# Patient Record
Sex: Female | Born: 1945 | Race: White | Hispanic: No | Marital: Married | Smoking: Never smoker
Health system: Southern US, Community
[De-identification: ages and names within clinical notes are randomized; demographics above are authoritative.]

## PROBLEM LIST (undated history)

## (undated) DIAGNOSIS — M1990 Unspecified osteoarthritis, unspecified site: Secondary | ICD-10-CM

## (undated) DIAGNOSIS — I10 Essential (primary) hypertension: Secondary | ICD-10-CM

## (undated) DIAGNOSIS — Z889 Allergy status to unspecified drugs, medicaments and biological substances status: Secondary | ICD-10-CM

## (undated) DIAGNOSIS — K219 Gastro-esophageal reflux disease without esophagitis: Secondary | ICD-10-CM

## (undated) HISTORY — PX: OTHER SURGICAL HISTORY: SHX169

## (undated) HISTORY — PX: APPENDECTOMY: SHX54

## (undated) HISTORY — PX: ABDOMINAL HYSTERECTOMY: SHX81

---

## 2001-05-23 ENCOUNTER — Encounter: Payer: Self-pay | Admitting: *Deleted

## 2001-05-23 ENCOUNTER — Emergency Department (HOSPITAL_COMMUNITY): Admission: EM | Admit: 2001-05-23 | Discharge: 2001-05-24 | Payer: Self-pay | Admitting: *Deleted

## 2001-07-19 ENCOUNTER — Encounter: Admission: RE | Admit: 2001-07-19 | Discharge: 2001-07-19 | Payer: Self-pay | Admitting: Family Medicine

## 2001-07-19 ENCOUNTER — Encounter: Payer: Self-pay | Admitting: Family Medicine

## 2001-08-15 ENCOUNTER — Encounter: Payer: Self-pay | Admitting: Family Medicine

## 2001-08-15 ENCOUNTER — Encounter: Admission: RE | Admit: 2001-08-15 | Discharge: 2001-08-15 | Payer: Self-pay | Admitting: Family Medicine

## 2009-11-02 ENCOUNTER — Encounter: Admission: RE | Admit: 2009-11-02 | Discharge: 2009-11-02 | Payer: Self-pay | Admitting: Internal Medicine

## 2011-05-03 ENCOUNTER — Other Ambulatory Visit: Payer: Self-pay | Admitting: Internal Medicine

## 2011-05-03 DIAGNOSIS — M79605 Pain in left leg: Secondary | ICD-10-CM

## 2011-05-03 DIAGNOSIS — M545 Low back pain: Secondary | ICD-10-CM

## 2011-05-24 ENCOUNTER — Other Ambulatory Visit: Payer: Self-pay

## 2011-05-31 ENCOUNTER — Ambulatory Visit
Admission: RE | Admit: 2011-05-31 | Discharge: 2011-05-31 | Disposition: A | Discharge status: Home / Self Care | Payer: Medicare Other | Source: Ambulatory Visit | Attending: Internal Medicine | Admitting: Internal Medicine

## 2011-05-31 DIAGNOSIS — M79605 Pain in left leg: Secondary | ICD-10-CM

## 2011-05-31 DIAGNOSIS — M545 Low back pain: Secondary | ICD-10-CM

## 2012-07-20 ENCOUNTER — Other Ambulatory Visit: Payer: Self-pay | Admitting: Neurosurgery

## 2012-07-20 DIAGNOSIS — M5410 Radiculopathy, site unspecified: Secondary | ICD-10-CM

## 2012-07-20 DIAGNOSIS — M549 Dorsalgia, unspecified: Secondary | ICD-10-CM

## 2012-07-30 ENCOUNTER — Other Ambulatory Visit: Payer: Medicare Other

## 2012-07-30 ENCOUNTER — Inpatient Hospital Stay
Admission: RE | Admit: 2012-07-30 | Discharge: 2012-07-30 | Disposition: A | Discharge status: Home / Self Care | Payer: Medicare Other | Source: Ambulatory Visit | Attending: Neurosurgery | Admitting: Neurosurgery

## 2012-08-02 ENCOUNTER — Ambulatory Visit
Admission: RE | Admit: 2012-08-02 | Discharge: 2012-08-02 | Disposition: A | Discharge status: Home / Self Care | Payer: Medicare Other | Source: Ambulatory Visit | Attending: Neurosurgery | Admitting: Neurosurgery

## 2012-08-02 VITALS — BP 118/57 | HR 69

## 2012-08-02 DIAGNOSIS — M549 Dorsalgia, unspecified: Secondary | ICD-10-CM

## 2012-08-02 DIAGNOSIS — M5410 Radiculopathy, site unspecified: Secondary | ICD-10-CM

## 2012-08-02 MED ORDER — MEPERIDINE HCL 100 MG/ML IJ SOLN
75.0000 mg | Freq: Once | INTRAMUSCULAR | Status: AC
  Administered 2012-08-02: 75 mg via INTRAMUSCULAR

## 2012-08-02 MED ORDER — DIAZEPAM 5 MG PO TABS
5.0000 mg | ORAL_TABLET | Freq: Once | ORAL | Status: AC
  Administered 2012-08-02: 5 mg via ORAL

## 2012-08-02 MED ORDER — ONDANSETRON HCL 4 MG/2ML IJ SOLN
4.0000 mg | Freq: Four times a day (QID) | INTRAMUSCULAR | Status: DC | PRN
  Administered 2012-08-02: 4 mg via INTRAVENOUS

## 2012-08-02 MED ORDER — IOHEXOL 180 MG/ML  SOLN
15.0000 mL | Freq: Once | INTRAMUSCULAR | Status: AC | PRN
  Administered 2012-08-02: 15 mL via INTRATHECAL

## 2012-08-02 MED ORDER — ONDANSETRON HCL 4 MG/2ML IJ SOLN
4.0000 mg | Freq: Once | INTRAMUSCULAR | Status: AC
  Administered 2012-08-02: 4 mg via INTRAMUSCULAR

## 2012-08-02 NOTE — Progress Notes (Signed)
Patient states her last dose of Tramadol was early Tuesday morning (07/31/12).  Discharge instructions explained to patient.  jkl

## 2012-08-02 NOTE — Progress Notes (Signed)
Husband at bedside.  Patient resting quietly without complaint.  jkl

## 2012-08-16 ENCOUNTER — Encounter (HOSPITAL_COMMUNITY): Payer: Self-pay | Admitting: Pharmacy Technician

## 2012-08-17 ENCOUNTER — Other Ambulatory Visit: Payer: Self-pay | Admitting: Neurosurgery

## 2012-08-17 ENCOUNTER — Encounter (HOSPITAL_COMMUNITY): Payer: Self-pay

## 2012-08-17 ENCOUNTER — Ambulatory Visit (HOSPITAL_COMMUNITY)
Admission: RE | Admit: 2012-08-17 | Discharge: 2012-08-17 | Disposition: A | Discharge status: Home / Self Care | Payer: Medicare Other | Source: Ambulatory Visit | Attending: Neurosurgery | Admitting: Neurosurgery

## 2012-08-17 ENCOUNTER — Encounter (HOSPITAL_COMMUNITY)
Admission: RE | Admit: 2012-08-17 | Discharge: 2012-08-17 | Disposition: A | Discharge status: Home / Self Care | Payer: Medicare Other | Source: Ambulatory Visit | Attending: Neurosurgery | Admitting: Neurosurgery

## 2012-08-17 DIAGNOSIS — Z01812 Encounter for preprocedural laboratory examination: Secondary | ICD-10-CM | POA: Insufficient documentation

## 2012-08-17 DIAGNOSIS — Z01810 Encounter for preprocedural cardiovascular examination: Secondary | ICD-10-CM | POA: Insufficient documentation

## 2012-08-17 DIAGNOSIS — I10 Essential (primary) hypertension: Secondary | ICD-10-CM | POA: Insufficient documentation

## 2012-08-17 DIAGNOSIS — Z01818 Encounter for other preprocedural examination: Secondary | ICD-10-CM | POA: Insufficient documentation

## 2012-08-17 HISTORY — DX: Unspecified osteoarthritis, unspecified site: M19.90

## 2012-08-17 HISTORY — DX: Essential (primary) hypertension: I10

## 2012-08-17 HISTORY — DX: Gastro-esophageal reflux disease without esophagitis: K21.9

## 2012-08-17 HISTORY — DX: Allergy status to unspecified drugs, medicaments and biological substances: Z88.9

## 2012-08-17 LAB — CBC
HCT: 40 % (ref 36.0–46.0)
Hemoglobin: 13.5 g/dL (ref 12.0–15.0)
MCH: 29.2 pg (ref 26.0–34.0)
MCHC: 33.8 g/dL (ref 30.0–36.0)
MCV: 86.6 fL (ref 78.0–100.0)
Platelets: 337 10*3/uL (ref 150–400)
RBC: 4.62 MIL/uL (ref 3.87–5.11)
RDW: 13.2 % (ref 11.5–15.5)
WBC: 12.7 10*3/uL — ABNORMAL HIGH (ref 4.0–10.5)

## 2012-08-17 LAB — TYPE AND SCREEN
ABO/RH(D): A POS
Antibody Screen: NEGATIVE

## 2012-08-17 LAB — COMPREHENSIVE METABOLIC PANEL
ALT: 14 U/L (ref 0–35)
AST: 18 U/L (ref 0–37)
Albumin: 4.2 g/dL (ref 3.5–5.2)
Alkaline Phosphatase: 68 U/L (ref 39–117)
BUN: 20 mg/dL (ref 6–23)
CO2: 27 mEq/L (ref 19–32)
Calcium: 9.8 mg/dL (ref 8.4–10.5)
Chloride: 104 mEq/L (ref 96–112)
Creatinine, Ser: 0.85 mg/dL (ref 0.50–1.10)
GFR calc Af Amer: 80 mL/min — ABNORMAL LOW (ref >90)
GFR calc non Af Amer: 69 mL/min — ABNORMAL LOW (ref >90)
Glucose, Bld: 90 mg/dL (ref 70–99)
Potassium: 4.1 mEq/L (ref 3.5–5.1)
Sodium: 141 mEq/L (ref 135–145)
Total Bilirubin: 0.3 mg/dL (ref 0.3–1.2)
Total Protein: 7.8 g/dL (ref 6.0–8.3)

## 2012-08-17 LAB — URINALYSIS, ROUTINE W REFLEX MICROSCOPIC
Bilirubin Urine: NEGATIVE
Glucose, UA: NEGATIVE mg/dL
Hgb urine dipstick: NEGATIVE
Ketones, ur: NEGATIVE mg/dL
Leukocytes, UA: NEGATIVE
Nitrite: NEGATIVE
Protein, ur: NEGATIVE mg/dL
Specific Gravity, Urine: 1.015 (ref 1.005–1.030)
Urobilinogen, UA: 0.2 mg/dL (ref 0.0–1.0)
pH: 7 (ref 5.0–8.0)

## 2012-08-17 LAB — PROTIME-INR
INR: 0.92 (ref 0.00–1.49)
Prothrombin Time: 12.3 seconds (ref 11.6–15.2)

## 2012-08-17 LAB — SURGICAL PCR SCREEN
MRSA, PCR: NEGATIVE
Staphylococcus aureus: NEGATIVE

## 2012-08-17 LAB — ABO/RH: ABO/RH(D): A POS

## 2012-08-17 LAB — APTT: aPTT: 31 seconds (ref 24–37)

## 2012-08-17 NOTE — Pre-Procedure Instructions (Signed)
Shelby Newton  08/17/2012   Your procedure is scheduled on:  MONDAY, MAY 12TH   Report to [facility] at 6:45 AM.  Call this number if you have problems the morning of surgery: [phone]   Remember:   Do not eat food or drink liquids after midnight Sunday.   Take these medicines the morning of surgery with A SIP OF WATER:  Hydrocodone   Do not wear jewelry, make-up or nail polish.  Do not wear lotions, powders, or perfumes. You may wear deodorant.  Do not shave underarms & legs 48 hours prior to surgery.    Do not bring valuables to the hospital.  Contacts, dentures or bridgework may not be worn into surgery.   Leave suitcase in the car. After surgery it may be brought to your room.  For patients admitted to the hospital, checkout time is 11:00 AM the day of discharge.              Name and phone number of your driver:   Shelby Newton  -   Spouse   Of  21  LOVELY & GREAT YEARS!!!   Special Instructions: Shower using CHG 2 nights before surgery and the night before surgery.  If you shower the day of surgery use CHG.  Use special wash - you have one bottle of CHG for all showers.  You should use approximately 1/3 of the bottle for each shower.   Please read over the following fact sheets that you were given: Pain Booklet, Blood Transfusion Information, MRSA Information and Surgical Site Infection Prevention

## 2012-08-17 NOTE — Progress Notes (Signed)
1600 left jessica a voicemail to have Dr. Lovell Sheehan give [place] some orders.   DA

## 2012-08-20 ENCOUNTER — Inpatient Hospital Stay (HOSPITAL_COMMUNITY): Payer: Medicare Other | Admitting: Anesthesiology

## 2012-08-20 ENCOUNTER — Encounter (HOSPITAL_COMMUNITY): Payer: Self-pay | Admitting: Vascular Surgery

## 2012-08-20 ENCOUNTER — Inpatient Hospital Stay (HOSPITAL_COMMUNITY)
Admission: RE | Admit: 2012-08-20 | Discharge: 2012-08-24 | DRG: 460 | Disposition: A | Discharge status: Home / Self Care | Payer: Medicare Other | Source: Ambulatory Visit | Attending: Neurosurgery | Admitting: Neurosurgery

## 2012-08-20 ENCOUNTER — Inpatient Hospital Stay (HOSPITAL_COMMUNITY): Payer: Medicare Other

## 2012-08-20 ENCOUNTER — Encounter (HOSPITAL_COMMUNITY): Payer: Self-pay | Admitting: *Deleted

## 2012-08-20 ENCOUNTER — Encounter (HOSPITAL_COMMUNITY)
Admission: RE | Disposition: A | Discharge status: Home / Self Care | Payer: Self-pay | Source: Ambulatory Visit | Attending: Neurosurgery

## 2012-08-20 DIAGNOSIS — M48062 Spinal stenosis, lumbar region with neurogenic claudication: Secondary | ICD-10-CM

## 2012-08-20 DIAGNOSIS — I10 Essential (primary) hypertension: Secondary | ICD-10-CM | POA: Diagnosis present

## 2012-08-20 DIAGNOSIS — G9741 Accidental puncture or laceration of dura during a procedure: Secondary | ICD-10-CM | POA: Diagnosis not present

## 2012-08-20 DIAGNOSIS — IMO0002 Reserved for concepts with insufficient information to code with codable children: Secondary | ICD-10-CM | POA: Diagnosis present

## 2012-08-20 DIAGNOSIS — M5137 Other intervertebral disc degeneration, lumbosacral region: Principal | ICD-10-CM | POA: Diagnosis present

## 2012-08-20 DIAGNOSIS — M51379 Other intervertebral disc degeneration, lumbosacral region without mention of lumbar back pain or lower extremity pain: Principal | ICD-10-CM | POA: Diagnosis present

## 2012-08-20 DIAGNOSIS — K219 Gastro-esophageal reflux disease without esophagitis: Secondary | ICD-10-CM | POA: Diagnosis present

## 2012-08-20 HISTORY — PX: TRANSFORAMINAL LUMBAR INTERBODY FUSION (TLIF) WITH PEDICLE SCREW FIXATION 1 LEVEL: SHX6141

## 2012-08-20 SURGERY — TRANSFORAMINAL LUMBAR INTERBODY FUSION (TLIF) WITH PEDICLE SCREW FIXATION 1 LEVEL
Anesthesia: General | Site: Back | Wound class: Clean

## 2012-08-20 MED ORDER — LIDOCAINE HCL (CARDIAC) 20 MG/ML IV SOLN
INTRAVENOUS | Status: DC | PRN
  Administered 2012-08-20: 50 mg via INTRAVENOUS

## 2012-08-20 MED ORDER — MIDAZOLAM HCL 5 MG/5ML IJ SOLN
INTRAMUSCULAR | Status: DC | PRN
  Administered 2012-08-20: 2 mg via INTRAVENOUS

## 2012-08-20 MED ORDER — HYDROCODONE-ACETAMINOPHEN 5-325 MG PO TABS
1.0000 | ORAL_TABLET | ORAL | Status: DC | PRN
  Administered 2012-08-23 (×2): 1 via ORAL
  Filled 2012-08-20 (×2): qty 1

## 2012-08-20 MED ORDER — LACTATED RINGERS IV SOLN
INTRAVENOUS | Status: DC | PRN
  Administered 2012-08-20 (×4): via INTRAVENOUS

## 2012-08-20 MED ORDER — ONDANSETRON HCL 4 MG/2ML IJ SOLN
INTRAMUSCULAR | Status: AC
  Filled 2012-08-20: qty 2

## 2012-08-20 MED ORDER — PROPOFOL 10 MG/ML IV BOLUS
INTRAVENOUS | Status: DC | PRN
  Administered 2012-08-20: 150 mg via INTRAVENOUS

## 2012-08-20 MED ORDER — PHENOL 1.4 % MT LIQD
1.0000 | OROMUCOSAL | Status: DC | PRN
Start: 2012-08-20 — End: 2012-08-24

## 2012-08-20 MED ORDER — LACTATED RINGERS IV SOLN
INTRAVENOUS | Status: DC
  Administered 2012-08-20 – 2012-08-21 (×2): via INTRAVENOUS
  Administered 2012-08-21: 75 mL/h via INTRAVENOUS
  Administered 2012-08-22: 04:00:00 via INTRAVENOUS

## 2012-08-20 MED ORDER — BACITRACIN ZINC 500 UNIT/GM EX OINT
TOPICAL_OINTMENT | CUTANEOUS | Status: DC | PRN
  Administered 2012-08-20: 1 via TOPICAL

## 2012-08-20 MED ORDER — FENTANYL CITRATE 0.05 MG/ML IJ SOLN
INTRAMUSCULAR | Status: DC | PRN
  Administered 2012-08-20: 50 ug via INTRAVENOUS
  Administered 2012-08-20: 80 ug via INTRAVENOUS
  Administered 2012-08-20: 50 ug via INTRAVENOUS

## 2012-08-20 MED ORDER — ALBUMIN HUMAN 5 % IV SOLN
INTRAVENOUS | Status: DC | PRN
  Administered 2012-08-20: 12:00:00 via INTRAVENOUS

## 2012-08-20 MED ORDER — GLYCOPYRROLATE 0.2 MG/ML IJ SOLN
INTRAMUSCULAR | Status: DC | PRN
  Administered 2012-08-20: .7 mg via INTRAVENOUS

## 2012-08-20 MED ORDER — ACETAMINOPHEN 650 MG RE SUPP: 650.0000 mg | RECTAL | Status: DC | PRN

## 2012-08-20 MED ORDER — SODIUM CHLORIDE 0.9 % IV SOLN
INTRAVENOUS | Status: AC
  Filled 2012-08-20: qty 500

## 2012-08-20 MED ORDER — OXYCODONE-ACETAMINOPHEN 5-325 MG PO TABS
1.0000 | ORAL_TABLET | ORAL | Status: DC | PRN
  Administered 2012-08-22: 2 via ORAL
  Administered 2012-08-23 – 2012-08-24 (×2): 1 via ORAL
  Filled 2012-08-20: qty 2
  Filled 2012-08-20: qty 1
  Filled 2012-08-20: qty 2
  Filled 2012-08-20: qty 1

## 2012-08-20 MED ORDER — PHENYLEPHRINE HCL 10 MG/ML IJ SOLN
INTRAMUSCULAR | Status: DC | PRN
Start: 2012-08-20 — End: 2012-08-20
  Administered 2012-08-20: 80 ug via INTRAVENOUS
  Administered 2012-08-20 (×2): 40 ug via INTRAVENOUS
  Administered 2012-08-20: 80 ug via INTRAVENOUS
  Administered 2012-08-20 (×2): 120 ug via INTRAVENOUS

## 2012-08-20 MED ORDER — NEOSTIGMINE METHYLSULFATE 1 MG/ML IJ SOLN
INTRAMUSCULAR | Status: DC | PRN
  Administered 2012-08-20: 4 mg via INTRAVENOUS

## 2012-08-20 MED ORDER — HYDROMORPHONE 0.3 MG/ML IV SOLN
INTRAVENOUS | Status: DC
  Administered 2012-08-20: 0.1 mg via INTRAVENOUS
  Administered 2012-08-21: 0.6 mg via INTRAVENOUS
  Administered 2012-08-21: 0.9 mg via INTRAVENOUS

## 2012-08-20 MED ORDER — SODIUM CHLORIDE 0.9 % IR SOLN
Status: DC | PRN
  Administered 2012-08-20: 11:00:00

## 2012-08-20 MED ORDER — SODIUM CHLORIDE 0.9 % IJ SOLN: 9.0000 mL | INTRAMUSCULAR | Status: DC | PRN

## 2012-08-20 MED ORDER — ONDANSETRON HCL 4 MG/2ML IJ SOLN
4.0000 mg | INTRAMUSCULAR | Status: DC | PRN
  Administered 2012-08-20 – 2012-08-22 (×9): 4 mg via INTRAVENOUS
  Filled 2012-08-20 (×9): qty 2

## 2012-08-20 MED ORDER — MENTHOL 3 MG MT LOZG: 1.0000 | LOZENGE | OROMUCOSAL | Status: DC | PRN

## 2012-08-20 MED ORDER — CEFAZOLIN SODIUM-DEXTROSE 2-3 GM-% IV SOLR
2.0000 g | INTRAVENOUS | Status: AC
  Administered 2012-08-20: 2 g via INTRAVENOUS

## 2012-08-20 MED ORDER — DIPHENHYDRAMINE HCL 12.5 MG/5ML PO ELIX: 12.5000 mg | ORAL_SOLUTION | Freq: Four times a day (QID) | ORAL | Status: DC | PRN

## 2012-08-20 MED ORDER — VALSARTAN-HYDROCHLOROTHIAZIDE 320-12.5 MG PO TABS: 1.0000 | ORAL_TABLET | Freq: Every day | ORAL | Status: DC

## 2012-08-20 MED ORDER — HYDROCHLOROTHIAZIDE 12.5 MG PO CAPS
12.5000 mg | ORAL_CAPSULE | Freq: Every day | ORAL | Status: DC
  Administered 2012-08-21 – 2012-08-24 (×3): 12.5 mg via ORAL
  Filled 2012-08-20 (×5): qty 1

## 2012-08-20 MED ORDER — HYDROMORPHONE HCL PF 1 MG/ML IJ SOLN
INTRAMUSCULAR | Status: DC | PRN
  Administered 2012-08-20: 0.5 mg via INTRAVENOUS

## 2012-08-20 MED ORDER — HYDROMORPHONE 0.3 MG/ML IV SOLN
INTRAVENOUS | Status: AC
  Administered 2012-08-20: 15:00:00
  Filled 2012-08-20: qty 25

## 2012-08-20 MED ORDER — BUPIVACAINE-EPINEPHRINE PF 0.5-1:200000 % IJ SOLN
INTRAMUSCULAR | Status: DC | PRN
  Administered 2012-08-20: 30 mL

## 2012-08-20 MED ORDER — ACETAMINOPHEN 325 MG PO TABS
650.0000 mg | ORAL_TABLET | ORAL | Status: DC | PRN
  Administered 2012-08-21: 650 mg via ORAL
  Filled 2012-08-20: qty 2

## 2012-08-20 MED ORDER — NALOXONE HCL 0.4 MG/ML IJ SOLN: 0.4000 mg | INTRAMUSCULAR | Status: DC | PRN

## 2012-08-20 MED ORDER — DIPHENHYDRAMINE HCL 50 MG/ML IJ SOLN: 12.5000 mg | Freq: Four times a day (QID) | INTRAMUSCULAR | Status: DC | PRN

## 2012-08-20 MED ORDER — IRBESARTAN 300 MG PO TABS
300.0000 mg | ORAL_TABLET | Freq: Every day | ORAL | Status: DC
  Administered 2012-08-21 – 2012-08-24 (×3): 300 mg via ORAL
  Filled 2012-08-20 (×5): qty 1

## 2012-08-20 MED ORDER — ROCURONIUM BROMIDE 100 MG/10ML IV SOLN
INTRAVENOUS | Status: DC | PRN
  Administered 2012-08-20 (×2): 10 mg via INTRAVENOUS
  Administered 2012-08-20: 50 mg via INTRAVENOUS
  Administered 2012-08-20: 10 mg via INTRAVENOUS

## 2012-08-20 MED ORDER — ONDANSETRON HCL 4 MG/2ML IJ SOLN: 4.0000 mg | Freq: Four times a day (QID) | INTRAMUSCULAR | Status: DC | PRN

## 2012-08-20 MED ORDER — DIAZEPAM 5 MG PO TABS
5.0000 mg | ORAL_TABLET | Freq: Four times a day (QID) | ORAL | Status: DC | PRN
  Administered 2012-08-23 – 2012-08-24 (×4): 5 mg via ORAL
  Filled 2012-08-20 (×4): qty 1

## 2012-08-20 MED ORDER — CEFAZOLIN SODIUM-DEXTROSE 2-3 GM-% IV SOLR
INTRAVENOUS | Status: AC
  Filled 2012-08-20: qty 50

## 2012-08-20 MED ORDER — SIMVASTATIN 10 MG PO TABS
10.0000 mg | ORAL_TABLET | Freq: Every day | ORAL | Status: DC
  Administered 2012-08-21: 10 mg via ORAL
  Filled 2012-08-20 (×5): qty 1

## 2012-08-20 MED ORDER — BACITRACIN 50000 UNITS IM SOLR
INTRAMUSCULAR | Status: AC
  Filled 2012-08-20: qty 1

## 2012-08-20 MED ORDER — THROMBIN 20000 UNITS EX SOLR
CUTANEOUS | Status: DC | PRN
  Administered 2012-08-20: 11:00:00 via TOPICAL

## 2012-08-20 MED ORDER — DOCUSATE SODIUM 100 MG PO CAPS
100.0000 mg | ORAL_CAPSULE | Freq: Two times a day (BID) | ORAL | Status: DC
  Administered 2012-08-21 – 2012-08-24 (×6): 100 mg via ORAL
  Filled 2012-08-20 (×6): qty 1

## 2012-08-20 MED ORDER — EPHEDRINE SULFATE 50 MG/ML IJ SOLN
INTRAMUSCULAR | Status: DC | PRN
  Administered 2012-08-20 (×2): 5 mg via INTRAVENOUS
  Administered 2012-08-20: 10 mg via INTRAVENOUS
  Administered 2012-08-20: 5 mg via INTRAVENOUS

## 2012-08-20 MED ORDER — MORPHINE SULFATE 2 MG/ML IJ SOLN
1.0000 mg | INTRAMUSCULAR | Status: DC | PRN
  Administered 2012-08-21: 2 mg via INTRAVENOUS
  Administered 2012-08-21: 4 mg via INTRAVENOUS
  Administered 2012-08-22: 2 mg via INTRAVENOUS
  Filled 2012-08-20: qty 1
  Filled 2012-08-20: qty 2
  Filled 2012-08-20: qty 1

## 2012-08-20 MED ORDER — ZOLPIDEM TARTRATE 5 MG PO TABS: 5.0000 mg | ORAL_TABLET | Freq: Every evening | ORAL | Status: DC | PRN

## 2012-08-20 MED ORDER — ALUM & MAG HYDROXIDE-SIMETH 200-200-20 MG/5ML PO SUSP: 30.0000 mL | Freq: Four times a day (QID) | ORAL | Status: DC | PRN

## 2012-08-20 MED ORDER — CEFAZOLIN SODIUM-DEXTROSE 2-3 GM-% IV SOLR
2.0000 g | Freq: Three times a day (TID) | INTRAVENOUS | Status: AC
  Administered 2012-08-20 – 2012-08-21 (×2): 2 g via INTRAVENOUS
  Filled 2012-08-20 (×3): qty 50

## 2012-08-20 SURGICAL SUPPLY — 83 items
APL SKNCLS STERI-STRIP NONHPOA (GAUZE/BANDAGES/DRESSINGS) ×1
APL SRG 60D 8 XTD TIP BNDBL (TIP) ×2
BAG DECANTER FOR FLEXI CONT (MISCELLANEOUS) ×2 IMPLANT
BENZOIN TINCTURE PRP APPL 2/3 (GAUZE/BANDAGES/DRESSINGS) ×2 IMPLANT
BLADE SURG ROTATE 9660 (MISCELLANEOUS) IMPLANT
BRUSH SCRUB EZ PLAIN DRY (MISCELLANEOUS) ×2 IMPLANT
BUR ACORN 6.0 (BURR) ×2 IMPLANT
BUR MATCHSTICK NEURO 3.0 LAGG (BURR) ×2 IMPLANT
CANISTER SUCTION 2500CC (MISCELLANEOUS) ×2 IMPLANT
CAP REVERE LOCKING (Cap) ×4 IMPLANT
CLOTH BEACON ORANGE TIMEOUT ST (SAFETY) ×2 IMPLANT
CONT SPEC 4OZ CLIKSEAL STRL BL (MISCELLANEOUS) ×2 IMPLANT
COVER BACK TABLE 24X17X13 BIG (DRAPES) IMPLANT
COVER TABLE BACK 60X90 (DRAPES) ×2 IMPLANT
DRAPE C-ARM 42X72 X-RAY (DRAPES) ×4 IMPLANT
DRAPE LAPAROTOMY 100X72X124 (DRAPES) ×2 IMPLANT
DRAPE MICROSCOPE ZEISS OPMI (DRAPES) ×1 IMPLANT
DRAPE POUCH INSTRU U-SHP 10X18 (DRAPES) ×2 IMPLANT
DRAPE PROXIMA HALF (DRAPES) ×2 IMPLANT
DRAPE SURG 17X23 STRL (DRAPES) ×8 IMPLANT
DURASEAL APPLICATOR TIP (TIP) ×2 IMPLANT
DURASEAL SPINE SEALANT 3ML (MISCELLANEOUS) ×2 IMPLANT
ELECT BLADE 4.0 EZ CLEAN MEGAD (MISCELLANEOUS) ×2
ELECT REM PT RETURN 9FT ADLT (ELECTROSURGICAL) ×2
ELECTRODE BLDE 4.0 EZ CLN MEGD (MISCELLANEOUS) ×1 IMPLANT
ELECTRODE REM PT RTRN 9FT ADLT (ELECTROSURGICAL) ×1 IMPLANT
GAUZE SPONGE 4X4 16PLY XRAY LF (GAUZE/BANDAGES/DRESSINGS) ×2 IMPLANT
GLOVE BIO SURGEON STRL SZ8.5 (GLOVE) ×4 IMPLANT
GLOVE BIOGEL PI IND STRL 7.0 (GLOVE) IMPLANT
GLOVE BIOGEL PI IND STRL 7.5 (GLOVE) IMPLANT
GLOVE BIOGEL PI IND STRL 8 (GLOVE) IMPLANT
GLOVE BIOGEL PI IND STRL 8.5 (GLOVE) IMPLANT
GLOVE BIOGEL PI INDICATOR 7.0 (GLOVE) ×1
GLOVE BIOGEL PI INDICATOR 7.5 (GLOVE) ×1
GLOVE BIOGEL PI INDICATOR 8 (GLOVE) ×1
GLOVE BIOGEL PI INDICATOR 8.5 (GLOVE) ×1
GLOVE ECLIPSE 7.5 STRL STRAW (GLOVE) ×1 IMPLANT
GLOVE ECLIPSE 8.5 STRL (GLOVE) ×2 IMPLANT
GLOVE EXAM NITRILE LRG STRL (GLOVE) ×2 IMPLANT
GLOVE EXAM NITRILE MD LF STRL (GLOVE) IMPLANT
GLOVE EXAM NITRILE XL STR (GLOVE) IMPLANT
GLOVE EXAM NITRILE XS STR PU (GLOVE) IMPLANT
GLOVE OPTIFIT SS 6.5 STRL BRWN (GLOVE) ×3 IMPLANT
GLOVE SS BIOGEL STRL SZ 6.5 (GLOVE) IMPLANT
GLOVE SS BIOGEL STRL SZ 8 (GLOVE) ×2 IMPLANT
GLOVE SUPERSENSE BIOGEL SZ 6.5 (GLOVE) ×4
GLOVE SUPERSENSE BIOGEL SZ 8 (GLOVE) ×2
GOWN BRE IMP SLV AUR LG STRL (GOWN DISPOSABLE) ×1 IMPLANT
GOWN BRE IMP SLV AUR XL STRL (GOWN DISPOSABLE) ×5 IMPLANT
GOWN STRL REIN 2XL LVL4 (GOWN DISPOSABLE) ×1 IMPLANT
KIT BASIN OR (CUSTOM PROCEDURE TRAY) ×2 IMPLANT
KIT ROOM TURNOVER OR (KITS) ×2 IMPLANT
NDL HYPO 21X1.5 SAFETY (NEEDLE) IMPLANT
NEEDLE HYPO 21X1.5 SAFETY (NEEDLE) IMPLANT
NEEDLE HYPO 22GX1.5 SAFETY (NEEDLE) ×2 IMPLANT
NS IRRIG 1000ML POUR BTL (IV SOLUTION) ×2 IMPLANT
PACK FOAM VITOSS 10CC (Orthopedic Implant) ×1 IMPLANT
PACK LAMINECTOMY NEURO (CUSTOM PROCEDURE TRAY) ×2 IMPLANT
PAD ARMBOARD 7.5X6 YLW CONV (MISCELLANEOUS) ×6 IMPLANT
PATTIES SURGICAL .5 X1 (DISPOSABLE) IMPLANT
PUTTY 10ML ACTIFUSE ABX (Putty) ×1 IMPLANT
ROD REVERE 6.35 40MM (Rod) ×2 IMPLANT
RUBBERBAND STERILE (MISCELLANEOUS) ×2 IMPLANT
SCREW REVERE 6.5X50MM (Screw) ×4 IMPLANT
SPACER SIGNATURE SM 10MM (Spacer) ×1 IMPLANT
SPONGE GAUZE 4X4 12PLY (GAUZE/BANDAGES/DRESSINGS) ×2 IMPLANT
SPONGE LAP 4X18 X RAY DECT (DISPOSABLE) IMPLANT
SPONGE NEURO XRAY DETECT 1X3 (DISPOSABLE) IMPLANT
SPONGE SURGIFOAM ABS GEL 100 (HEMOSTASIS) ×2 IMPLANT
STRIP CLOSURE SKIN 1/2X4 (GAUZE/BANDAGES/DRESSINGS) ×2 IMPLANT
STRIP VITOSS 25X100X4MM (Neuro Prosthesis/Implant) ×1 IMPLANT
SUT PROLENE 6 0 BV (SUTURE) ×6 IMPLANT
SUT VIC AB 1 CT1 18XBRD ANBCTR (SUTURE) ×2 IMPLANT
SUT VIC AB 1 CT1 8-18 (SUTURE) ×4
SUT VIC AB 2-0 CP2 18 (SUTURE) ×4 IMPLANT
SYR 20CC LL (SYRINGE) IMPLANT
SYR 20ML ECCENTRIC (SYRINGE) ×2 IMPLANT
TAPE CLOTH SURG 4X10 WHT LF (GAUZE/BANDAGES/DRESSINGS) ×1 IMPLANT
TOWEL OR 17X24 6PK STRL BLUE (TOWEL DISPOSABLE) ×2 IMPLANT
TOWEL OR 17X26 10 PK STRL BLUE (TOWEL DISPOSABLE) ×2 IMPLANT
TRAY FOLEY CATH 14FRSI W/METER (CATHETERS) ×2 IMPLANT
TUBE CONNECTING 12X1/4 (SUCTIONS) ×1 IMPLANT
WATER STERILE IRR 1000ML POUR (IV SOLUTION) ×2 IMPLANT

## 2012-08-20 NOTE — Progress Notes (Signed)
UR COMPLETED  

## 2012-08-20 NOTE — Anesthesia Postprocedure Evaluation (Signed)
  Anesthesia Post-op Note  Patient: Shelby Newton  Procedure(s) Performed: Procedure(s) with comments: TRANSFORAMINAL LUMBAR INTERBODY FUSION (TLIF) WITH PEDICLE SCREW FIXATION ONE LEVEL (N/A) - Posteior Lumbar Four-Five Transforaminal Interbody and Fusion with Interbody Prothesis, Posterolateral Arthrodesis, and Posterior Nonsegmental Instrumentation  Patient Location: PACU  Anesthesia Type:General  Level of Consciousness: awake  Airway and Oxygen Therapy: Patient Spontanous Breathing  Post-op Pain: mild  Post-op Assessment: Post-op Vital signs reviewed  Post-op Vital Signs: Reviewed  Complications: No apparent anesthesia complications

## 2012-08-20 NOTE — Progress Notes (Signed)
Patient ID: Shelby Newton, female   DOB: 14-May-1945, 67 y.o.   MRN: [id number] Subjective: The patient is somnolent but easily arousable. She looks well. She is in no apparent distress.  Objective: Vital signs in last 24 hours: Temp:  [97 F (36.1 C)-98.2 F (36.8 C)] 98.2 F (36.8 C) (05/12 1600) Pulse Rate:  [62-95] 76 (05/12 1600) Resp:  [10-47] 12 (05/12 1600) BP: (152-162)/(51-82) 152/69 mmHg (05/12 1600) SpO2:  [93 %-97 %] 97 % (05/12 1600) Weight:  [86.592 kg (190 lb 14.4 oz)] 86.592 kg (190 lb 14.4 oz) (05/12 1615)  Intake/Output from previous day:   Intake/Output this shift: Total I/O In: 3550 [I.V.:3300; IV Piggyback:250] Out: 1000 [Urine:900; Blood:100]  Physical exam the patient's strength is normal in her bile gastrocnemius and dorsiflexors.  Lab Results: No results found for this basename: WBC, HGB, HCT, PLT,  in the last 72 hours BMET No results found for this basename: NA, K, CL, CO2, GLUCOSE, BUN, CREATININE, CALCIUM,  in the last 72 hours  Studies/Results: Dg Lumbar Spine 2-3 Views  08/20/2012  *RADIOLOGY REPORT*  Clinical Data: Lumbar fusion  DG C-ARM 1-60 MIN,LUMBAR SPINE - 2-3 VIEW  Comparison: Earlier films of the same day  Findings: Two intraoperative fluoroscopic spot images document placement bilateral pedicle screws at L4 and L5.  Graft markers project in the interspace.  IMPRESSION:  PLIF L4-5   Original Report Authenticated By: D. Andria Rhein, MD    Dg Lumbar Spine 1 View  08/20/2012  *RADIOLOGY REPORT*  Clinical Data: 67 year old female undergoing lumbar surgery.  LUMBAR SPINE - 1 VIEW  Comparison: CT lumbar myelogram 08/02/2012.  Findings: Portable cross-table lateral intraoperative view of the lumbar spine labeled film #1 at 1105 hours.  Using the same numbering system as on 08/02/2012, the surgical probe projects at the L4-L5 disc space.  IMPRESSION: Intraoperative localization at L4-L5.   Original Report Authenticated By: Erskine Speed, M.D.     Dg C-arm 1-60 Min  08/20/2012  *RADIOLOGY REPORT*  Clinical Data: Lumbar fusion  DG C-ARM 1-60 MIN,LUMBAR SPINE - 2-3 VIEW  Comparison: Earlier films of the same day  Findings: Two intraoperative fluoroscopic spot images document placement bilateral pedicle screws at L4 and L5.  Graft markers project in the interspace.  IMPRESSION:  PLIF L4-5   Original Report Authenticated By: D. Andria Rhein, MD     Assessment/Plan: Patient is doing well.   LOS: 0 days     Kiet Geer D 08/20/2012, 4:52 PM

## 2012-08-20 NOTE — Progress Notes (Signed)
Patient ID: Shelby Newton, female   DOB: Nov 25, 1945, 67 y.o.   MRN: [id number] Subjective:  The patient is alert and pleasant. She looks well. She is in no apparent distress.  Objective: Vital signs in last 24 hours: Temp:  [97.6 F (36.4 C)-97.8 F (36.6 C)] 97.8 F (36.6 C) (05/12 1454) Pulse Rate:  [62-95] 95 (05/12 1458) Resp:  [18-47] 22 (05/12 1509) BP: (157-162)/(55-82) 162/55 mmHg (05/12 1457) SpO2:  [94 %-96 %] 94 % (05/12 1458)  Intake/Output from previous day:   Intake/Output this shift: Total I/O In: 3550 [I.V.:3300; IV Piggyback:250] Out: 850 [Urine:750; Blood:100]  Physical exam the patient is alert and pleasant. Her strength is 5 over 5 in her bilateral gastrocnemius, extensor hallucis longus. Her dressing is clean and dry.  Lab Results:  Recent Labs  08/17/12 1543  WBC 12.7*  HGB 13.5  HCT 40.0  PLT 337   BMET  Recent Labs  08/17/12 1543  NA 141  K 4.1  CL 104  CO2 27  GLUCOSE 90  BUN 20  CREATININE 0.85  CALCIUM 9.8    Studies/Results: Dg Lumbar Spine 1 View  08/20/2012  *RADIOLOGY REPORT*  Clinical Data: 67 year old female undergoing lumbar surgery.  LUMBAR SPINE - 1 VIEW  Comparison: CT lumbar myelogram 08/02/2012.  Findings: Portable cross-table lateral intraoperative view of the lumbar spine labeled film #1 at 1105 hours.  Using the same numbering system as on 08/02/2012, the surgical probe projects at the L4-L5 disc space.  IMPRESSION: Intraoperative localization at L4-L5.   Original Report Authenticated By: Erskine Speed, M.D.     Assessment/Plan: The patient is doing well. Secondary to the durotomy, we'll keep her in bed tonight.  LOS: 0 days     Alyanna Stoermer D 08/20/2012, 3:14 PM

## 2012-08-20 NOTE — Op Note (Signed)
Brief history: The patient is a 67 year old white female who has complained of chronic back and right leg pain. She has failed medical management and was worked up with a lumbar MRI and a lumbar myelo CT. This demonstrated the patient had spinal stenosis and severe disc degeneration at L4-5. I discussed the various treatment options with the patient including surgery. She has weighed the risks, benefits, and alternatives surgery and decided proceed with a lumbar decompression, instrumentation, and fusion.  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 Etc. requiring a wide decompression of the nerve roots.); L4-5 transforaminal lumbar interbody fusion with local morselized autograft bone and Actifusebone graft extender; insertion of interbody prosthesis at 045 (globus peek interbody prosthesis); posterior nonsegmental instrumentation from L4 to L5 with globus titanium pedicle screws and rods; posterior lateral arthrodesis at L4-5 with local morselized autograft bone and Vitoss bone graft extender.  Surgeon: Dr. Delma Officer  Asst.: Dr. Barnett Abu  Anesthesia: Gen. endotracheal  Estimated blood loss: 200 cc  Drains: None  Complications: Incidental durotomy  Description of procedure: The patient was brought to the operating room by the anesthesia team. General endotracheal anesthesia was induced. The patient was turned to the prone position on the Wilson frame. The patient's lumbosacral region was then prepared with Betadine scrub and Betadine solution. Sterile drapes were applied.  I then  injected the area to be incised with Marcaine with epinephrine solution. I then used the scalpel to make a linear midline incision over the L4-5 interspace. I then used electrocautery to perform a bilateral subperiosteal dissection exposing the spinous process and lamina of L4 and L5. We then obtained intraoperative radiograph to confirm our location. We then inserted the Verstrac retractor to provide exposure.  I began the decompression by using the high speed drill to perform laminotomies at L4 bilaterally. We then used the Kerrison punches to widen the laminotomy and removed the ligamentum flavum at L4-5 bilaterally. We used the Kerrison punches to remove the medial facets at 45. We performed wide foraminotomies about the bilateral L4 and L5 nerve roots completing the decompression. During the process of decompressing the L5 nerve root right, I didn't create a durotomy. We closed this with interrupted 6-0 Prolene suture. At the end of this case I placed  DuraSeal over the durotomy.  We now turned our attention to the posterior lumbar interbody fusion. I used a scalpel to incise the intervertebral disc at L4-5 on the right. I then performed a partial intervertebral discectomy at L4-5 on the right using the pituitary forceps. We prepared the vertebral endplates at L4-5 on the right for the fusion by removing the soft tissues with the curettes. We then used the trial spacers to pick the appropriate sized interbody prosthesis. We prefilled his prosthesis with a combination of local morselized autograft bone that we obtained during the decompression as well as Actifuse bone graft extender. We inserted the prefilled prosthesis into the interspace at L4-5 on the right. There was a good snug fit of the prosthesis in the interspace. We then filled and the remainder of the intervertebral disc space with local morselized autograft bone and Actifuse. This completed the posterior  lumbar interbody arthrodesis.  We now  turned attention to the instrumentation. Under fluoroscopic guidance we cannulated the bilateral L4 and L5 pedicles with the bone probe. We then removed the bone probe. We then tapped the pedicle with a 0.5 millimeter tap. We then removed the tap. We probed inside the tapped pedicle with a ball probe to rule out cortical breaches. We then inserted a 6.5 x 50 millimeter pedicle screw into the L4 and L5 pedicles bilaterally under fluoroscopic guidance. We then palpated along the medial aspect of the pedicles to rule out cortical breaches. There were none. The nerve roots were not injured. We then connected the unilateral pedicle screws with a lordotic rod. We compressed the construct and secured the rod in place with the caps. We then tightened the caps appropriately. This completed the instrumentation from L4-L5.  We now turned our attention to the posterior lateral arthrodesis at L4-5. We used the high-speed drill to decorticate the remainder of the facets, pars, transverse process at L4-5. We then applied a combination of local morselized autograft bone and Vitoss bone graft extender over these decorticated posterior lateral structures. This completed the posterior lateral arthrodesis.  We then obtained hemostasis using bipolar electrocautery. We irrigated the wound out with bacitracin solution. We inspected the thecal sac and nerve roots and noted they were well decompressed. We then removed the retractor. We placed a medium Hemovac drain in the epidural space and tunneled out through separate stab wound. We reapproximated patient's thoracolumbar fascia with interrupted #1 Vicryl suture. We reapproximated patient's subcutaneous tissue with interrupted 2-0 Vicryl suture. The reapproximated patient's skin with Steri-Strips and benzoin. The wound was then coated with bacitracin ointment. A sterile dressing was applied. The drapes were removed. The patient was subsequently returned to the supine position where  they were extubated by the anesthesia team. He was then transported to the post anesthesia care unit in stable condition. All sponge instrument and needle counts were reportedly correct at the end of this case.

## 2012-08-20 NOTE — Transfer of Care (Signed)
Immediate Anesthesia Transfer of Care Note  Patient: Shelby Newton  Procedure(s) Performed: Procedure(s) with comments: TRANSFORAMINAL LUMBAR INTERBODY FUSION (TLIF) WITH PEDICLE SCREW FIXATION ONE LEVEL (N/A) - Posteior Lumbar Four-Five Transforaminal Interbody and Fusion with Interbody Prothesis, Posterolateral Arthrodesis, and Posterior Nonsegmental Instrumentation  Patient Location: PACU  Anesthesia Type:General  Level of Consciousness: awake, alert , oriented and patient cooperative  Airway & Oxygen Therapy: Patient Spontanous Breathing and Patient connected to nasal cannula oxygen  Post-op Assessment: Report given to PACU RN, Post -op Vital signs reviewed and stable and Patient moving all extremities X 4  Post vital signs: Reviewed and stable  Complications: No apparent anesthesia complications

## 2012-08-20 NOTE — H&P (Signed)
Subjective: Patient is a 67 year old white female who is complaining of back and right leg pain. She has failed medical management and was worked up with a lumbar MRI and a lumbar mild CT. These studies demonstrated this degeneration and stenosis at L4-5. I discussed the various treatment options with the patient including surgery. She has weighed the risks, benefits, and alternatives surgery decided proceed with an L4-5 decompression, each patient, and fusion.   Past Medical History  Diagnosis Date  . H/O seasonal allergies   . GERD (gastroesophageal reflux disease)     TAKES ZANTAC PRIOR TO TOMATOES  . 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 Hypertension     ON MEDS 10-12 YRS NOW    Past Surgical History  Procedure Laterality Date  . Appendectomy    . Abdominal hysterectomy      Allergies  Allergen Reactions  . Sulfa Antibiotics Anaphylaxis  . Macrodantin (Nitrofurantoin Macrocrystal) Hives, Itching and Swelling    History  Substance Use Topics  . Smoking status: Never Smoker   . Smokeless tobacco: Not on file  . Alcohol Use: No     Comment: RARE GLASS OF WINE    History reviewed. No pertinent family history. Prior to Admission medications   Medication Sig Start Date End Date Taking? Authorizing Provider  aspirin EC 81 MG tablet Take 81 mg by mouth daily.   Yes Historical Provider, MD  cholecalciferol (VITAMIN D) 1000 UNITS tablet Take 1,000 Units by mouth daily.   Yes Historical Provider, MD  HYDROcodone-acetaminophen (NORCO/VICODIN) 5-325 MG per tablet Take 1 tablet by mouth every 6 (six) hours as needed for pain.   Yes Historical Provider, MD  lovastatin (MEVACOR) 20 MG tablet Take 20 mg by mouth at bedtime.   Yes Historical Provider, MD  naproxen sodium (ANAPROX) 220 MG tablet Take 220 mg by mouth 2 (two) times daily with a meal.   Yes Historical Provider, MD  valsartan-hydrochlorothiazide (DIOVAN-HCT) 320-12.5 MG per tablet Take 1 tablet by mouth daily.   Yes Historical  Provider, MD     Review of Systems  Positive ROS: As above  All other systems have been reviewed and were otherwise negative with the exception of those mentioned in the HPI and as above.  Objective: Vital signs in last 24 hours: Temp:  [97.6 F (36.4 C)] 97.6 F (36.4 C) (05/12 0745) Pulse Rate:  [62] 62 (05/12 0745) Resp:  [18] 18 (05/12 0745) BP: (157)/(82) 157/82 mmHg (05/12 0745) SpO2:  [96 %] 96 % (05/12 0745)  General Appearance: Alert, cooperative, no distress, appears stated age Head: Normocephalic, without obvious abnormality, atraumatic Eyes: PERRL, conjunctiva/corneas clear, EOM's intact, fundi benign, both eyes      Ears: Normal TM's and external ear canals, both ears Throat: Lips, mucosa, and tongue normal; teeth and gums normal Neck: Supple, symmetrical, trachea midline, no adenopathy; thyroid: No enlargement/tenderness/nodules; no carotid bruit or JVD Back: Symmetric, no curvature, ROM normal, no CVA tenderness Lungs: Clear to auscultation bilaterally, respirations unlabored Heart: Regular rate and rhythm, S1 and S2 normal, no murmur, rub or gallop Abdomen: Soft, non-tender, bowel sounds active all four quadrants, no masses, no organomegaly Extremities: Extremities normal, atraumatic, no cyanosis or edema Pulses: 2+ and symmetric all extremities Skin: Skin color, texture, turgor normal, no rashes or lesions  NEUROLOGIC:   Mental status: alert and oriented, no aphasia, good attention span, Fund of knowledge/ memory ok Motor Exam - grossly normal Sensory Exam - grossly normal Reflexes:  Coordination -  grossly normal Gait - grossly normal Balance - grossly normal Cranial Nerves: I: smell Not tested  II: visual acuity  OS: Normal    OD:  normal  II: visual fields Full to confrontation  II: pupils Equal, round, reactive to light  III,VII: ptosis None  III,IV,VI: extraocular muscles  Full ROM  V: mastication Normal  V: facial light touch sensation  Normal   V,VII: corneal reflex  Present  VII: facial muscle function - upper  Normal  VII: facial muscle function - lower Normal  VIII: hearing Not tested  IX: soft palate elevation  Normal  IX,X: gag reflex Present  XI: trapezius strength  5/5  XI: sternocleidomastoid strength 5/5  XI: neck flexion strength  5/5  XII: tongue strength  Normal    Data Review Lab Results  Component Value Date   WBC 12.7* 08/17/2012   HGB 13.5 08/17/2012   HCT 40.0 08/17/2012   MCV 86.6 08/17/2012   PLT 337 08/17/2012   Lab Results  Component Value Date   NA 141 08/17/2012   K 4.1 08/17/2012   CL 104 08/17/2012   CO2 27 08/17/2012   BUN 20 08/17/2012   CREATININE 0.85 08/17/2012   GLUCOSE 90 08/17/2012   Lab Results  Component Value Date   INR 0.92 08/17/2012    Assessment/Plan: L4-5 degenerative disc disease, lumbago, lumbar radiculopathy, spinal stenosis: I discussed the situation with the patient. I have reviewed her imaging studies with her. I've pointed out the abnormalities. We have discussed the various treatment options including surgery. I described the surgical option of an L4-5 decompression, instrumentation, and fusion. I described the surgery to her. I've shown her surgical models. We have discussed the risks, benefits, alternatives, and likelihood of achieving our goals with surgery. I have answered all the patient's questions. She has decided to proceed with surgery.   Isiah Scheel D 08/20/2012 9:59 AM

## 2012-08-20 NOTE — Anesthesia Preprocedure Evaluation (Addendum)
Anesthesia Evaluation  Patient identified by MRN, date of birth, ID band Patient awake    Reviewed: Allergy & Precautions, H&P , NPO status   History of Anesthesia Complications (+) PONV  Airway Mallampati: II TM Distance: >3 FB Neck ROM: Full    Dental  (+) Dental Advisory Given   Pulmonary neg pulmonary ROS,          Cardiovascular hypertension, Pt. on medications     Neuro/Psych    GI/Hepatic Neg liver ROS, GERD-  Medicated and Controlled,  Endo/Other  negative endocrine ROS  Renal/GU      Musculoskeletal   Abdominal   Peds  Hematology   Anesthesia Other Findings   Reproductive/Obstetrics                          Anesthesia Physical Anesthesia Plan  ASA: III  Anesthesia Plan: General   Post-op Pain Management:    Induction: Intravenous  Airway Management Planned: Oral ETT  Additional Equipment:   Intra-op Plan:   Post-operative Plan: Extubation in OR  Informed Consent: I have reviewed the patients History and Physical, chart, labs and discussed the procedure including the risks, benefits and alternatives for the proposed anesthesia with the patient or authorized representative who has indicated his/her understanding and acceptance.     Plan Discussed with: CRNA, Anesthesiologist and Surgeon  Anesthesia Plan Comments:         Anesthesia Quick Evaluation

## 2012-08-20 NOTE — Anesthesia Procedure Notes (Signed)
Procedure Name: Intubation Date/Time: 08/20/2012 10:28 AM Performed by: Rogelia Boga Pre-anesthesia Checklist: Patient identified, Emergency Drugs available, Suction available, Patient being monitored and Timeout performed Patient Re-evaluated:Patient Re-evaluated prior to inductionOxygen Delivery Method: Circle system utilized Preoxygenation: Pre-oxygenation with 100% oxygen Intubation Type: IV induction Ventilation: Mask ventilation without difficulty and Oral airway inserted - appropriate to patient size Laryngoscope Size: Mac and 4 Grade View: Grade II Tube type: Oral Tube size: 7.5 mm Number of attempts: 1 Airway Equipment and Method: Stylet Placement Confirmation: ETT inserted through vocal cords under direct vision,  positive ETCO2 and breath sounds checked- equal and bilateral Secured at: 21 cm Tube secured with: Tape Dental Injury: Teeth and Oropharynx as per pre-operative assessment and Injury to lip  Comments: Small Nick to left lower lip

## 2012-08-20 NOTE — Progress Notes (Signed)
Notified Dr. Lovell Sheehan of orders needing signed, he stated he would sign.

## 2012-08-20 NOTE — Preoperative (Signed)
Beta Blockers   Reason not to administer Beta Blockers:Not Applicable 

## 2012-08-21 ENCOUNTER — Encounter (HOSPITAL_COMMUNITY): Payer: Self-pay | Admitting: Neurosurgery

## 2012-08-21 LAB — CBC
HCT: 31.6 % — ABNORMAL LOW (ref 36.0–46.0)
Hemoglobin: 10.3 g/dL — ABNORMAL LOW (ref 12.0–15.0)
MCH: 28.7 pg (ref 26.0–34.0)
MCHC: 32.6 g/dL (ref 30.0–36.0)
MCV: 88 fL (ref 78.0–100.0)
Platelets: 234 10*3/uL (ref 150–400)
RBC: 3.59 MIL/uL — ABNORMAL LOW (ref 3.87–5.11)
RDW: 13.2 % (ref 11.5–15.5)
WBC: 12.5 10*3/uL — ABNORMAL HIGH (ref 4.0–10.5)

## 2012-08-21 LAB — BASIC METABOLIC PANEL
BUN: 13 mg/dL (ref 6–23)
CO2: 27 mEq/L (ref 19–32)
Calcium: 8.6 mg/dL (ref 8.4–10.5)
Chloride: 103 mEq/L (ref 96–112)
Creatinine, Ser: 0.69 mg/dL (ref 0.50–1.10)
GFR calc Af Amer: >90 mL/min (ref >90)
GFR calc non Af Amer: 87 mL/min — ABNORMAL LOW (ref >90)
Glucose, Bld: 108 mg/dL — ABNORMAL HIGH (ref 70–99)
Potassium: 3.7 mEq/L (ref 3.5–5.1)
Sodium: 138 mEq/L (ref 135–145)

## 2012-08-21 MED ORDER — KETOROLAC TROMETHAMINE 30 MG/ML IJ SOLN
30.0000 mg | Freq: Four times a day (QID) | INTRAMUSCULAR | Status: DC | PRN
  Administered 2012-08-21 – 2012-08-24 (×6): 30 mg via INTRAVENOUS
  Filled 2012-08-21 (×6): qty 1

## 2012-08-21 MED ORDER — PROMETHAZINE HCL 25 MG/ML IJ SOLN
25.0000 mg | Freq: Four times a day (QID) | INTRAMUSCULAR | Status: DC | PRN
  Administered 2012-08-21 – 2012-08-22 (×3): 25 mg via INTRAVENOUS
  Filled 2012-08-21 (×3): qty 1

## 2012-08-21 NOTE — Progress Notes (Signed)
Patient's foley cath removed at 0530 and tolerated well.

## 2012-08-21 NOTE — Progress Notes (Signed)
Orthopedic Tech Progress Note Patient Details:  Shelby Newton 03/09/1946 [id number]  Patient ID: [name], female   DOB: Mar 03, 1946, 67 y.o.   MRN: [id number]   [name] 08/21/2012, 10:40 AMLumbar fusion brace completed by [employer] .

## 2012-08-21 NOTE — Progress Notes (Signed)
Patient ID: [place] Newton, female   DOB: 06-09-1945, 67 y.o.   MRN: [id number] Subjective:  the patient is alert. She complains ofsevere nausea.she has a mild headache. I spoke with her husband.  Objective: Vital signs in last 24 hours: Temp:  [97 F (36.1 C)-98.2 F (36.8 C)] 97.9 F (36.6 C) (05/13 0144) Pulse Rate:  [73-96] 86 (05/13 0144) Resp:  [9-47] 9 (05/13 0400) BP: (141-162)/(51-69) 146/57 mmHg (05/13 0144) SpO2:  [93 %-98 %] 98 % (05/13 0400) Weight:  [86.592 kg (190 lb 14.4 oz)] 86.592 kg (190 lb 14.4 oz) (05/12 1615)  Intake/Output from previous day: 05/12 0701 - 05/13 0700 In: 3550 [I.V.:3300; IV Piggyback:250] Out: 1300 [Urine:1200; Blood:100] Intake/Output this shift:    Physical exam the patient is alert and oriented. Her strength is grossly normal in her lower extremities. Her dressing is clean and dry.  Lab Results: No results found for this basename: WBC, HGB, HCT, PLT,  in the last 72 hours BMET No results found for this basename: NA, K, CL, CO2, GLUCOSE, BUN, CREATININE, CALCIUM,  in the last 72 hours  Studies/Results: Dg Lumbar Spine 2-3 Views  08/20/2012  *RADIOLOGY REPORT*  Clinical Data: Lumbar fusion  DG C-ARM 1-60 MIN,LUMBAR SPINE - 2-3 VIEW  Comparison: Earlier films of the same day  Findings: Two intraoperative fluoroscopic spot images document placement bilateral pedicle screws at L4 and L5.  Graft markers project in the interspace.  IMPRESSION:  PLIF L4-5   Original Report Authenticated By: D. Andria Rhein, MD    Dg Lumbar Spine 1 View  08/20/2012  *RADIOLOGY REPORT*  Clinical Data: 66 year old female undergoing lumbar surgery.  LUMBAR SPINE - 1 VIEW  Comparison: CT lumbar myelogram 08/02/2012.  Findings: Portable cross-table lateral intraoperative view of the lumbar spine labeled film #1 at 1105 hours.  Using the same numbering system as on 08/02/2012, the surgical probe projects at the L4-L5 disc space.  IMPRESSION: Intraoperative localization  at L4-L5.   Original Report Authenticated By: Erskine Speed, M.D.    Dg C-arm 1-60 Min  08/20/2012  *RADIOLOGY REPORT*  Clinical Data: Lumbar fusion  DG C-ARM 1-60 MIN,LUMBAR SPINE - 2-3 VIEW  Comparison: Earlier films of the same day  Findings: Two intraoperative fluoroscopic spot images document placement bilateral pedicle screws at L4 and L5.  Graft markers project in the interspace.  IMPRESSION:  PLIF L4-5   Original Report Authenticated By: D. Andria Rhein, MD     Assessment/Plan: Postop day 1: The patient is doing well neurologically. Her biggest problem is her nausea. She thinks it may be related to the PCA pump use. I will discontinued the Dilaudid. I will keep her and address today and mobilize her tomorrow.  LOS: 1 day     Bliss Behnke D 08/21/2012, 7:53 AM

## 2012-08-21 NOTE — Progress Notes (Signed)
Orthopedic Tech Progress Note Patient Details:  S[place] Newton 1945-06-12 [id number]  Patient ID: [name], female   DOB: 01/12/1946, 67 y.o.   MRN: [id number]   [name] 08/21/2012, 8:46 AMCalled [employer] for aspen lumbar fusion brace.

## 2012-08-22 MED ORDER — OXYCODONE-ACETAMINOPHEN 10-325 MG PO TABS: 1.0000 | ORAL_TABLET | ORAL | Status: DC | PRN

## 2012-08-22 MED ORDER — DSS 100 MG PO CAPS: 100.0000 mg | ORAL_CAPSULE | Freq: Two times a day (BID) | ORAL | Status: DC

## 2012-08-22 MED ORDER — DIAZEPAM 5 MG PO TABS: 5.0000 mg | ORAL_TABLET | Freq: Four times a day (QID) | ORAL | Status: DC | PRN

## 2012-08-22 MED FILL — Heparin Sodium (Porcine) Inj 1000 Unit/ML: INTRAMUSCULAR | Qty: 30 | Status: AC

## 2012-08-22 MED FILL — Sodium Chloride IV Soln 0.9%: INTRAVENOUS | Qty: 1000 | Status: AC

## 2012-08-22 NOTE — Progress Notes (Signed)
Newton ID: [place] Newton, female   DOB: [birth year], 67 y.o.   MRN: [id number] Subjective:  S[place] Newton is alert and pleasant. She looks much better today.she is pleased that her hip pain has resolved with surgery.  Objective: Vital signs in last 24 hours: Temp:  [98.6 F (37 C)-99.9 F (37.7 C)] 98.7 F (37.1 C) (05/14 0649) Pulse Rate:  [73-98] 79 (05/14 0649) Resp:  [16-20] 18 (05/14 0649) BP: (111-149)/(39-53) 111/39 mmHg (05/14 0649) SpO2:  [76 %-99 %] 96 % (05/14 0649)  Intake/Output from previous day:   Intake/Output this shift:    Physical exam [place] Newton is alert and oriented. Her strength is normal in her bilateral gastrocnemius and extensor hallucis longus. Her dressing is clean and dry.  Lab Results:  Recent Labs  08/21/12 0825  WBC 12.5*  HGB 10.3*  HCT 31.6*  PLT 234   BMET  Recent Labs  08/21/12 0825  NA 138  K 3.7  CL 103  CO2 27  GLUCOSE 108*  BUN 13  CREATININE 0.69  CALCIUM 8.6    Studies/Results: Dg Lumbar Spine 2-3 Views  08/20/2012   *RADIOLOGY REPORT*  Clinical Data: Lumbar fusion  DG C-ARM 1-60 MIN,LUMBAR SPINE - 2-3 VIEW  Comparison: Earlier films of [place] same day  Findings: Two intraoperative fluoroscopic spot images document placement bilateral pedicle screws at L4 and L5.  Graft markers project in [place] interspace.  IMPRESSION:  PLIF L4-5   Original Report Authenticated By: D. Andria Rhein, MD   Dg Lumbar Spine 1 View  08/20/2012   *RADIOLOGY REPORT*  Clinical Data: 67 year old female undergoing lumbar surgery.  LUMBAR SPINE - 1 VIEW  Comparison: CT lumbar myelogram 08/02/2012.  Findings: Portable cross-table lateral intraoperative view of [place] lumbar spine labeled film #1 at 1105 hours.  Using [place] same numbering system as on 08/02/2012, [place] surgical probe projects at [place] L4-L5 disc space.  IMPRESSION: Intraoperative localization at L4-L5.   Original Report Authenticated By: Erskine Speed, M.D.   Dg C-arm 1-60 Min  08/20/2012    *RADIOLOGY REPORT*  Clinical Data: Lumbar fusion  DG C-ARM 1-60 MIN,LUMBAR SPINE - 2-3 VIEW  Comparison: Earlier films of [place] same day  Findings: Two intraoperative fluoroscopic spot images document placement bilateral pedicle screws at L4 and L5.  Graft markers project in [place] interspace.  IMPRESSION:  PLIF L4-5   Original Report Authenticated By: D. Andria Rhein, MD    Assessment/Plan: Postop day #2: [place] Newton is doing well. We'll mobilize her with PT and OT. She will likely go home in [place] next day or 2.I have given Shelby Newton oral and written discharge instructions and her discharge prescriptions.I have answered all Shelby Newton and her husband's questions. I'll ask Dr. Danielle Dess to see Shelby Newton tomorrow.   LOS: 2 days     Augustina Braddock D 08/22/2012, 9:01 AM

## 2012-08-22 NOTE — Progress Notes (Signed)
Pt able to ambulate to the bathroom 1 assist with walker. Pt still feels nauseated despite taking very little pain medicine this morning. C/o slight headache around 330pm. Medicated with phenergan and IV toradol and had some relief. Unable to participate with therapy d/t nausea. Will continue to monitor. Carolyn Maniscalco, [place], RN

## 2012-08-22 NOTE — Progress Notes (Signed)
PT Cancellation Note  Patient Details Name: [place] Newton MRN: [id number] DOB: 02/24/1946   Cancelled Treatment:    Reason Eval/Treat Not Completed: Medical issues which prohibited therapy. Recurrent nausea and HA.  Will see 5/15 as able. 08/22/2012  Almira Bing, PT [phone]  (pager)   Precious Gilchrest, Eliseo Gum 08/22/2012, 3:20 PM

## 2012-08-23 NOTE — Evaluation (Signed)
Occupational Therapy Evaluation Patient Details Name: [place] Newton MRN: [id number] DOB: 1945/09/11 Today's Date: 08/23/2012 Time: 0820-0906 OT Time Calculation (min): 46 min  OT Assessment / Plan / Recommendation Clinical Impression   67 yo female s/p  Posteior Lumbar Four-Five Transforaminal Interbody and Fusion with Interbody Prothesis, Posterolateral Arthrodesis, and Posterior Nonsegmental Instrumentation that could benefit from skilled OT acutely. Recommend HHOT, RW and 3n1 for d/c planning.      OT Assessment  Patient needs continued OT Services    Follow Up Recommendations  Home health OT    Barriers to Discharge      Equipment Recommendations  3 in 1 bedside comode;Other (comment) (RW)    Recommendations for Other Services    Frequency  Min 2X/week    Precautions / Restrictions Precautions Precautions: Back;Fall Precaution Booklet Issued: Yes (comment) Precaution Comments: Back Required Braces or Orthoses: Spinal Brace Spinal Brace: Applied in sitting position Restrictions Weight Bearing Restrictions: No   Pertinent Vitals/Pain Limited by HA 10 out 10 HA- reports decr after bath and valium Pt describes feeling "throb in head" with every step    ADL  Eating/Feeding: Modified independent Where Assessed - Eating/Feeding: Chair Grooming: Wash/dry hands;Wash/dry face;Teeth care;Supervision/safety Where Assessed - Grooming: Unsupported sitting Upper Body Bathing: Chest;Right arm;Left arm;Abdomen;Supervision/safety Where Assessed - Upper Body Bathing: Unsupported sitting Lower Body Bathing: Supervision/safety Where Assessed - Lower Body Bathing: Unsupported sitting Upper Body Dressing: Supervision/safety Where Assessed - Upper Body Dressing: Unsupported sitting Lower Body Dressing: Supervision/safety Where Assessed - Lower Body Dressing: Unsupported sitting Toilet Transfer: Supervision/safety Toilet Transfer Method: Sit to stand Toilet Transfer Equipment:  Raised toilet seat with arms (or 3-in-1 over toilet) Toileting - Clothing Manipulation and Hygiene: Supervision/safety Where Assessed - Toileting Clothing Manipulation and Hygiene: Sit to stand from 3-in-1 or toilet Equipment Used: Back brace;Gait belt;Standard walker Transfers/Ambulation Related to ADLs: Pt ambulated with decr gait speed and needed cues for safety with walker ADL Comments: Pt agreeable to oob but reports extreme HA. Pt premedicated by RN Pt ambulated to restroom completed full ADL (bath grooming change clothes and don doff brace) Pt educated this session on don / doff brace and now independent. Pt provided handout to cover all adls. pt with excellent return demo.    OT Diagnosis: Generalized weakness;Acute pain  OT Problem List: Decreased strength;Decreased activity tolerance;Impaired balance (sitting and/or standing);Decreased safety awareness;Decreased knowledge of use of DME or AE;Decreased knowledge of precautions;Pain OT Treatment Interventions: Self-care/ADL training;DME and/or AE instruction;Therapeutic activities;Patient/family education;Balance training   OT Goals Acute Rehab OT Goals OT Goal Formulation: With patient/family Time For Goal Achievement: 09/06/12 Potential to Achieve Goals: Good ADL Goals Pt Will Perform Lower Body Bathing: with modified independence;Standing at sink;Sit to stand from chair ADL Goal: Lower Body Bathing - Progress: Goal set today Pt Will Perform Tub/Shower Transfer: with modified independence;Ambulation;with DME ADL Goal: Tub/Shower Transfer - Progress: Goal set today Miscellaneous OT Goals Miscellaneous OT Goal #1: Pt will complete grooming task maintaining back precautions MOD I with no cues OT Goal: Miscellaneous Goal #1 - Progress: Goal set today  Visit Information  Last OT Received On: 08/23/12 Assistance Needed: +1    Subjective Data  Subjective: "I want to be able to go on my cruise with family ane enjoy it" Patient Stated  Goal: We take a cruise every year and I want to be able to walk and do things and get back to a more independent life   Prior Functioning     Home Living Lives With: Spouse  Available Help at Discharge: Family Type of Home: House Home Access: Stairs to enter Secretary/administrator of Steps: 3 Entrance Stairs-Rails: None Home Layout: Multi-level;1/2 bath on main level;Bed/bath upstairs Alternate Level Stairs-Number of Steps: standard flight 13-15 Alternate Level Stairs-Rails: Right Bathroom Shower/Tub: Health visitor: Standard Home Adaptive Equipment: None Prior Function Level of Independence: Independent Able to Take Stairs?: Yes Driving: Yes Vocation: Retired Musician: No difficulties Dominant Hand: Right         Vision/Perception Vision - History Baseline Vision: Wears glasses all the time Patient Visual Report: No change from baseline   Cognition  Cognition Arousal/Alertness: Awake/alert Behavior During Therapy: WFL for tasks assessed/performed Overall Cognitive Status: Within Functional Limits for tasks assessed    Extremity/Trunk Assessment Right Upper Extremity Assessment RUE ROM/Strength/Tone: Within functional levels RUE Sensation: WFL - Light Touch RUE Coordination: WFL - gross/fine motor Left Upper Extremity Assessment LUE ROM/Strength/Tone: Within functional levels LUE Sensation: WFL - Light Touch LUE Coordination: WFL - gross/fine motor Trunk Assessment Trunk Assessment: Normal     Mobility Bed Mobility Bed Mobility: Rolling Right;Right Sidelying to Sit;Sitting - Scoot to [place] of Bed Rolling Right: 4: Min guard Right Sidelying to Sit: 4: Min guard;HOB flat Sitting - Scoot to [place] of Bed: 4: Min guard Details for Bed Mobility Assistance: min cues for precautions, reviewed with Pt before starting mobility Transfers Transfers: Sit to Stand;Stand to Sit Sit to Stand: 5: Supervision;With upper extremity  assist;From bed Stand to Sit: 5: Supervision;With upper extremity assist;To chair/3-in-1 Details for Transfer Assistance: cues for hand placement and safety with RW     Exercise     Balance Balance Balance Assessed: Yes Static Standing Balance Static Standing - Balance Support: No upper extremity supported;During functional activity Static Standing - Level of Assistance: 5: Stand by assistance Static Standing - Comment/# of Minutes: standing at sink for bath    End of Session OT - End of Session Activity Tolerance: Patient tolerated treatment well Patient left: in chair;with call bell/phone within reach Nurse Communication: Mobility status;Precautions  GO     Lucile Shutters 08/23/2012, 9:41 AM Pager: [phone]

## 2012-08-23 NOTE — Evaluation (Addendum)
Physical Therapy Evaluation Patient Details Name: [place] Newton MRN: [id number] DOB: 1945/12/07 Today's Date: 08/23/2012 Time: 4696-2952 PT Time Calculation (min): 27 min  PT Assessment / Plan / Recommendation Clinical Impression   67 yo female s/p  Posteior Lumbar Four-Five Transforaminal Interbody and Fusion with Interbody Prothesis and Posterolateral Arthrodesis.   Recommend RW and 3n1 for d/c planning.     PT Assessment   Will see 1 more time to wrap up and complete education   Follow Up Recommendations  No PT follow up    Does the patient have the potential to tolerate intense rehabilitation      Barriers to Discharge        Equipment Recommendations  Rolling walker with 5" wheels;Other (comment) (3 in 1)    Recommendations for Other Services     Frequency Min 3X/week    Precautions / Restrictions Precautions Precautions: Back Precaution Booklet Issued: Yes (comment) Precaution Comments: Back Required Braces or Orthoses: Spinal Brace Spinal Brace: Applied in sitting position   Pertinent Vitals/Pain 2/10 at incision      Mobility  Bed Mobility Bed Mobility: Not assessed Transfers Transfers: Sit to Stand;Stand to Sit Sit to Stand: 5: Supervision;With upper extremity assist;From chair/3-in-1 Stand to Sit: 5: Supervision;With upper extremity assist;To chair/3-in-1 Details for Transfer Assistance: safe mobility Ambulation/Gait Ambulation/Gait Assistance: 5: Supervision Ambulation Distance (Feet): 800 Feet Assistive device: Rolling walker Ambulation/Gait Assistance Details: steady gait Gait Pattern: Within Functional Limits Gait velocity: slower    Exercises     PT Diagnosis:    PT Problem List:   PT Treatment Interventions:     PT Goals Acute Rehab PT Goals Potential to Achieve Goals: Good Ambulate >150 feet with mod Indepence and leas restrictive assistive device Goal set today Pt will verbalize/demo. 3/3 back precautions Goal set  today  Visit Information  Last PT Received On: 08/23/12 Assistance Needed: +1    Subjective Data  Subjective: We're ready to go I think   Prior Functioning       Cognition  Cognition Arousal/Alertness: Awake/alert Behavior During Therapy: WFL for tasks assessed/performed Overall Cognitive Status: Within Functional Limits for tasks assessed    Extremity/Trunk Assessment     Balance Balance Balance Assessed: No  End of Session PT - End of Session Equipment Utilized During Treatment: Back brace Activity Tolerance: Patient tolerated treatment well Patient left: in chair;with call bell/phone within reach;with family/visitor present Nurse Communication: Mobility status  GP     Kristofor Michalowski, Eliseo Gum 08/23/2012, 11:08 AM 08/23/2012  [place] Bing, PT [phone]  (pager)

## 2012-08-24 MED ORDER — KETOROLAC TROMETHAMINE 10 MG PO TABS: 10.0000 mg | ORAL_TABLET | Freq: Four times a day (QID) | ORAL | Status: DC | PRN

## 2012-08-24 NOTE — Progress Notes (Signed)
Occupational Therapy Treatment Patient Details Name: [place] Newton MRN: [id number] DOB: [birth year] Today's Date: 08/24/2012 Time: 8469-6295 OT Time Calculation (min): 11 min  OT Assessment / Plan / Recommendation Comments on Treatment Session Pt has met all OT goals and adequate level for d/c. Pt now with pain management of HA which has incr independence    Follow Up Recommendations  Home health OT    Barriers to Discharge       Equipment Recommendations  3 in 1 bedside comode;Other (comment)    Recommendations for Other Services    Frequency Min 2X/week   Plan Discharge plan remains appropriate    Precautions / Restrictions Precautions Precautions: Back Precaution Booklet Issued: Yes (comment) Precaution Comments: Back Required Braces or Orthoses: Spinal Brace Spinal Brace: Applied in sitting position Restrictions Weight Bearing Restrictions: No   Pertinent Vitals/Pain No pain reported    ADL  Toilet Transfer: Modified independent Toilet Transfer Method: Sit to Barista: Regular height toilet;Grab bars Toileting - Clothing Manipulation and Hygiene: Modified independent Where Assessed - Toileting Clothing Manipulation and Hygiene: Sit to stand from 3-in-1 or toilet Equipment Used: Back brace;Gait belt;Standard walker Transfers/Ambulation Related to ADLs: Pt MOD I with RW ADL Comments: Pt reports decr HA now and progressing well. Pt reports taking shower already this AM. pt completed sit<<> stand from chair and toilet transfer. MOD i for grooming at sink level. NO further education needed. Pt educated and familiar with toilet aid due to sister uses them. Pt familiar with location to purchase toilet aide    OT Diagnosis:    OT Problem List:   OT Treatment Interventions:     OT Goals Acute Rehab OT Goals OT Goal Formulation: With patient/family Time For Goal Achievement: 09/06/12 Potential to Achieve Goals: Good ADL Goals Pt Will Perform  Lower Body Bathing: with modified independence;Standing at sink;Sit to stand from chair ADL Goal: Lower Body Bathing - Progress: Met Pt Will Perform Tub/Shower Transfer: with modified independence;Ambulation;with DME ADL Goal: Tub/Shower Transfer - Progress: Met Miscellaneous OT Goals Miscellaneous OT Goal #1: Pt will complete grooming task maintaining back precautions MOD I with no cues OT Goal: Miscellaneous Goal #1 - Progress: Met  Visit Information  Last OT Received On: 08/24/12 Assistance Needed: +1    Subjective Data      Prior Functioning       Cognition  Cognition Arousal/Alertness: Awake/alert Behavior During Therapy: WFL for tasks assessed/performed Overall Cognitive Status: Within Functional Limits for tasks assessed    Mobility  Bed Mobility Bed Mobility: Not assessed Transfers Sit to Stand: 6: Modified independent (Device/Increase time);With upper extremity assist;From bed Stand to Sit: 6: Modified independent (Device/Increase time);With upper extremity assist;To chair/3-in-1 Details for Transfer Assistance: safe mobility    Exercises      Balance Balance Balance Assessed: No   End of Session OT - End of Session Activity Tolerance: Patient tolerated treatment well Patient left: in chair;with call bell/phone within reach;with family/visitor present Nurse Communication: Mobility status;Precautions  GO     Lucile Shutters 08/24/2012, 1:32 PM Pager: [phone]

## 2012-08-24 NOTE — Discharge Summary (Signed)
Physician Discharge Summary  Patient ID: [place] Newton MRN: [id number] DOB/AGE: May 17, 1945 67 y.o.  Admit date: 08/20/2012 Discharge date: 08/24/2012  Admission Diagnoses:  Discharge Diagnoses:  Active Problems:   * No active hospital problems. *   Discharged Condition: good  Hospital Course: Patient in the hospital where she underwent a lumbar decompression and fusion with good results. Postoperatively back in r pain much improved. Strength and sensation intact. Ambulating well. Patient had some difficulty with intermittent headaches but has no evidence of wound drainage or evidence of obvious CSF leak. Plan is for discharge home. Follow up with Dr. Lovell Sheehan in 1 week.  Consults:   Significant Diagnostic Studies:   Treatments:   Discharge Exam: Blood pressure 154/57, pulse 75, temperature 98.1 F (36.7 C), temperature source Oral, resp. rate 20, height 5\' 3"  (1.6 m), weight 86.592 kg (190 lb 14.4 oz), SpO2 92.00%. Awake and alert. Oriented and appropriate. Cranial nerve function intact. Motor and sensory function of the extremities normal. Wound clean and dry. Chest and abdomen benign.  Disposition: Final discharge disposition not confirmed  Discharge Orders   Future Orders Complete By Expires     For home use only DME 3 n 1  As directed     For home use only DME Walker rolling  As directed         Medication List    STOP taking these medications       HYDROcodone-acetaminophen 5-325 MG per tablet  Commonly known as:  NORCO/VICODIN     naproxen sodium 220 MG tablet  Commonly known as:  ANAPROX      TAKE these medications       aspirin EC 81 MG tablet  Take 81 mg by mouth daily.     cholecalciferol 1000 UNITS tablet  Commonly known as:  VITAMIN D  Take 1,000 Units by mouth daily.     diazepam 5 MG tablet  Commonly known as:  VALIUM  Take 1 tablet (5 mg total) by mouth every 6 (six) hours as needed.     DSS 100 MG Caps  Take 100 mg by mouth 2 (two)  times daily.     lovastatin 20 MG tablet  Commonly known as:  MEVACOR  Take 20 mg by mouth at bedtime.     oxyCODONE-acetaminophen 10-325 MG per tablet  Commonly known as:  PERCOCET  Take 1 tablet by mouth every 4 (four) hours as needed for pain.     valsartan-hydrochlorothiazide 320-12.5 MG per tablet  Commonly known as:  DIOVAN-HCT  Take 1 tablet by mouth daily.           Follow-up Information   Follow up with Cristi Loron, MD. Call in 1 week.   Contact information:   [address] [place] [address] [phone]       Signed: Temple Pacini 08/24/2012, 12:14 PM

## 2012-08-24 NOTE — Progress Notes (Signed)
Physical Therapy Treatment Patient Details Name: Shelby Newton MRN: [id number] DOB: 04/03/1946 Today's Date: 08/24/2012 Time: 1030-1057 PT Time Calculation (min): 27 min  PT Assessment / Plan / Recommendation Comments on Treatment Session  Completed all back education with pt and husband.  All questions answered.  No further PT needs.  Sign off.    Follow Up Recommendations  No PT follow up     Does the patient have the potential to tolerate intense rehabilitation     Barriers to Discharge        Equipment Recommendations  Rolling walker with 5" wheels;Other (comment) (3 in 1)    Recommendations for Other Services    Frequency Min 3X/week   Plan Discharge plan remains appropriate;Frequency remains appropriate    Precautions / Restrictions Precautions Precautions: Back Precaution Booklet Issued: Yes (comment) Precaution Comments: Back Required Braces or Orthoses: Spinal Brace Spinal Brace: Applied in sitting position   Pertinent Vitals/Pain Just sore, no real pain    Mobility  Bed Mobility Bed Mobility: Not assessed Transfers Transfers: Sit to Stand;Stand to Sit Sit to Stand: 5: Supervision;With upper extremity assist;From chair/3-in-1 Stand to Sit: 5: Supervision;With upper extremity assist;To chair/3-in-1 Details for Transfer Assistance: safe mobility Ambulation/Gait Ambulation/Gait Assistance: 5: Supervision Ambulation Distance (Feet): 800 Feet Assistive device: Rolling walker Ambulation/Gait Assistance Details: steady gait Gait Pattern: Within Functional Limits Gait velocity: slower    Exercises     PT Diagnosis:    PT Problem List:   PT Treatment Interventions:     PT Goals Acute Rehab PT Goals Time For Goal Achievement: 08/27/12 Potential to Achieve Goals: Good Pt will Ambulate: >150 feet;with modified independence PT Goal: Ambulate - Progress: Met Additional Goals Additional Goal #1: verbalizes/demo's 3/3 back precautions PT Goal:  Additional Goal #1 - Progress: Met  Visit Information  Last PT Received On: 08/24/12 Assistance Needed: +1    Subjective Data  Subjective: We're ready to go I think   Cognition  Cognition Arousal/Alertness: Awake/alert Behavior During Therapy: WFL for tasks assessed/performed Overall Cognitive Status: Within Functional Limits for tasks assessed    Balance  Balance Balance Assessed: No  End of Session PT - End of Session Equipment Utilized During Treatment: Back brace Activity Tolerance: Patient tolerated treatment well Patient left: in chair;with call bell/phone within reach;with family/visitor present Nurse Communication: Mobility status   GP     Caidence Higashi, Eliseo Gum 08/24/2012, 11:13 AM

## 2012-08-27 ENCOUNTER — Encounter (HOSPITAL_COMMUNITY): Payer: Self-pay | Admitting: *Deleted

## 2012-08-27 ENCOUNTER — Inpatient Hospital Stay (HOSPITAL_COMMUNITY)
Admission: AD | Admit: 2012-08-27 | Discharge: 2012-08-30 | DRG: 103 | Disposition: A | Discharge status: Home / Self Care | Payer: Medicare Other | Source: Ambulatory Visit | Attending: Neurosurgery | Admitting: Neurosurgery

## 2012-08-27 ENCOUNTER — Encounter (HOSPITAL_COMMUNITY)
Admission: AD | Disposition: A | Discharge status: Home / Self Care | Payer: Self-pay | Source: Ambulatory Visit | Attending: Neurosurgery

## 2012-08-27 DIAGNOSIS — Z7982 Long term (current) use of aspirin: Secondary | ICD-10-CM

## 2012-08-27 DIAGNOSIS — K5900 Constipation, unspecified: Secondary | ICD-10-CM | POA: Diagnosis present

## 2012-08-27 DIAGNOSIS — I10 Essential (primary) hypertension: Secondary | ICD-10-CM | POA: Diagnosis present

## 2012-08-27 DIAGNOSIS — Z79899 Other long term (current) drug therapy: Secondary | ICD-10-CM

## 2012-08-27 DIAGNOSIS — R51 Headache: Principal | ICD-10-CM | POA: Diagnosis present

## 2012-08-27 DIAGNOSIS — E860 Dehydration: Secondary | ICD-10-CM | POA: Diagnosis present

## 2012-08-27 DIAGNOSIS — Z981 Arthrodesis status: Secondary | ICD-10-CM

## 2012-08-27 DIAGNOSIS — K219 Gastro-esophageal reflux disease without esophagitis: Secondary | ICD-10-CM | POA: Diagnosis present

## 2012-08-27 DIAGNOSIS — R110 Nausea: Secondary | ICD-10-CM | POA: Diagnosis present

## 2012-08-27 SURGERY — CANCELLED PROCEDURE

## 2012-08-27 MED ORDER — OXYCODONE-ACETAMINOPHEN 5-325 MG PO TABS
1.0000 | ORAL_TABLET | ORAL | Status: DC | PRN
  Filled 2012-08-27: qty 2

## 2012-08-27 MED ORDER — KETOROLAC TROMETHAMINE 30 MG/ML IJ SOLN: 30.0000 mg | Freq: Four times a day (QID) | INTRAMUSCULAR | Status: DC | PRN

## 2012-08-27 MED ORDER — PHENOL 1.4 % MT LIQD: 1.0000 | OROMUCOSAL | Status: DC | PRN

## 2012-08-27 MED ORDER — ALUM & MAG HYDROXIDE-SIMETH 200-200-20 MG/5ML PO SUSP: 30.0000 mL | Freq: Four times a day (QID) | ORAL | Status: DC | PRN

## 2012-08-27 MED ORDER — ONDANSETRON HCL 4 MG/2ML IJ SOLN: 4.0000 mg | INTRAMUSCULAR | Status: DC | PRN

## 2012-08-27 MED ORDER — LACTATED RINGERS IV SOLN
INTRAVENOUS | Status: DC
  Administered 2012-08-27 – 2012-08-28 (×3): via INTRAVENOUS

## 2012-08-27 MED ORDER — DIAZEPAM 5 MG PO TABS: 5.0000 mg | ORAL_TABLET | Freq: Four times a day (QID) | ORAL | Status: DC | PRN

## 2012-08-27 MED ORDER — MENTHOL 3 MG MT LOZG: 1.0000 | LOZENGE | OROMUCOSAL | Status: DC | PRN

## 2012-08-27 MED ORDER — FLEET ENEMA 7-19 GM/118ML RE ENEM: 1.0000 | ENEMA | Freq: Every day | RECTAL | Status: DC | PRN

## 2012-08-27 MED ORDER — BISACODYL 10 MG RE SUPP: 10.0000 mg | Freq: Every day | RECTAL | Status: DC | PRN

## 2012-08-27 MED ORDER — MORPHINE SULFATE 2 MG/ML IJ SOLN: 1.0000 mg | INTRAMUSCULAR | Status: DC | PRN

## 2012-08-27 MED ORDER — HYDROCODONE-ACETAMINOPHEN 5-325 MG PO TABS: 1.0000 | ORAL_TABLET | ORAL | Status: DC | PRN

## 2012-08-27 MED ORDER — ACETAMINOPHEN 650 MG RE SUPP: 650.0000 mg | RECTAL | Status: DC | PRN

## 2012-08-27 MED ORDER — DOCUSATE SODIUM 100 MG PO CAPS
100.0000 mg | ORAL_CAPSULE | Freq: Two times a day (BID) | ORAL | Status: DC
  Administered 2012-08-27 – 2012-08-28 (×3): 100 mg via ORAL
  Filled 2012-08-27 (×5): qty 1

## 2012-08-27 MED ORDER — ZOLPIDEM TARTRATE 5 MG PO TABS: 5.0000 mg | ORAL_TABLET | Freq: Every evening | ORAL | Status: DC | PRN

## 2012-08-27 MED ORDER — ACETAMINOPHEN 325 MG PO TABS
650.0000 mg | ORAL_TABLET | ORAL | Status: DC | PRN
  Administered 2012-08-28 – 2012-08-29 (×4): 650 mg via ORAL
  Filled 2012-08-27 (×4): qty 2

## 2012-08-27 SURGICAL SUPPLY — 35 items
APL SKNCLS STERI-STRIP NONHPOA (GAUZE/BANDAGES/DRESSINGS) ×1
BAG DECANTER FOR FLEXI CONT (MISCELLANEOUS) ×3 IMPLANT
BENZOIN TINCTURE PRP APPL 2/3 (GAUZE/BANDAGES/DRESSINGS) ×3 IMPLANT
BLADE SURG ROTATE 9660 (MISCELLANEOUS) IMPLANT
CANISTER SUCTION 2500CC (MISCELLANEOUS) ×3 IMPLANT
CLOTH BEACON ORANGE TIMEOUT ST (SAFETY) ×3 IMPLANT
CONT SPEC 4OZ CLIKSEAL STRL BL (MISCELLANEOUS) IMPLANT
DRAPE LAPAROTOMY 100X72X124 (DRAPES) ×3 IMPLANT
DRAPE POUCH INSTRU U-SHP 10X18 (DRAPES) ×3 IMPLANT
DRAPE SURG 17X23 STRL (DRAPES) ×12 IMPLANT
ELECT REM PT RETURN 9FT ADLT (ELECTROSURGICAL) ×2
ELECTRODE REM PT RTRN 9FT ADLT (ELECTROSURGICAL) ×2 IMPLANT
GAUZE SPONGE 4X4 16PLY XRAY LF (GAUZE/BANDAGES/DRESSINGS) IMPLANT
GLOVE BIO SURGEON STRL SZ8.5 (GLOVE) ×3 IMPLANT
GLOVE EXAM NITRILE LRG STRL (GLOVE) IMPLANT
GLOVE EXAM NITRILE MD LF STRL (GLOVE) IMPLANT
GLOVE EXAM NITRILE XL STR (GLOVE) IMPLANT
GLOVE EXAM NITRILE XS STR PU (GLOVE) IMPLANT
GLOVE SS BIOGEL STRL SZ 8 (GLOVE) ×2 IMPLANT
GLOVE SUPERSENSE BIOGEL SZ 8 (GLOVE) ×1
GOWN BRE IMP SLV AUR LG STRL (GOWN DISPOSABLE) IMPLANT
GOWN BRE IMP SLV AUR XL STRL (GOWN DISPOSABLE) IMPLANT
KIT BASIN OR (CUSTOM PROCEDURE TRAY) ×3 IMPLANT
KIT ROOM TURNOVER OR (KITS) ×3 IMPLANT
NEEDLE HYPO 22GX1.5 SAFETY (NEEDLE) IMPLANT
NS IRRIG 1000ML POUR BTL (IV SOLUTION) ×3 IMPLANT
PAD ARMBOARD 7.5X6 YLW CONV (MISCELLANEOUS) ×9 IMPLANT
SPONGE GAUZE 4X4 12PLY (GAUZE/BANDAGES/DRESSINGS) ×3 IMPLANT
STRIP CLOSURE SKIN 1/2X4 (GAUZE/BANDAGES/DRESSINGS) ×3 IMPLANT
SWAB CULTURE LIQ STUART DBL (MISCELLANEOUS) IMPLANT
SYR 20ML ECCENTRIC (SYRINGE) ×3 IMPLANT
TOWEL OR 17X24 6PK STRL BLUE (TOWEL DISPOSABLE) ×3 IMPLANT
TOWEL OR 17X26 10 PK STRL BLUE (TOWEL DISPOSABLE) ×3 IMPLANT
TUBE ANAEROBIC SPECIMEN COL (MISCELLANEOUS) IMPLANT
WATER STERILE IRR 1000ML POUR (IV SOLUTION) ×3 IMPLANT

## 2012-08-27 NOTE — H&P (Signed)
Subjective: The patient is a 67 year old white female who I performed an L4-5 decompression, agitation, and fusion on 1 week ago. During that surgery there was a incidental durotomy which was closed with 6-0 Prolene suture. The patient was subsequently discharged. Patient has had a headache. She called today I admitted her for observation.  The patient complains of a frontal headache which worsens with lying down. She has had no drainage from her wound. Presently she feels fine. She has been a bit nauseated but has had no vomiting, fever or meningismus. Toradol helps her headaches. The patient feels her back is doing well and her leg pain has resolved with surgery. She has been ambulating well.  Past Medical History  Diagnosis Date  . H/O seasonal allergies   . GERD (gastroesophageal reflux disease)     TAKES ZANTAC PRIOR TO TOMATOES  . 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 Hypertension     ON MEDS 10-12 YRS NOW    Past Surgical History  Procedure Laterality Date  . Appendectomy    . Abdominal hysterectomy    . Transforaminal lumbar interbody fusion (tlif) with pedicle screw fixation 1 level N/A 08/20/2012    Procedure: TRANSFORAMINAL LUMBAR INTERBODY FUSION (TLIF) WITH PEDICLE SCREW FIXATION ONE LEVEL;  Surgeon: Cristi Loron, MD;  Location: [facility];  Service: Neurosurgery;  Laterality: N/A;  Posteior Lumbar Four-Five Transforaminal Interbody and Fusion with Interbody Prothesis, Posterolateral Arthrodesis, and Posterior Nonsegmental Instrumentation    Allergies  Allergen Reactions  . Sulfa Antibiotics Anaphylaxis  . Macrodantin (Nitrofurantoin Macrocrystal) Hives, Itching and Swelling    History  Substance Use Topics  . Smoking status: Never Smoker   . Smokeless tobacco: Not on file  . Alcohol Use: No     Comment: RARE GLASS OF WINE    No family history on file. Prior to Admission medications   Medication Sig Start Date End Date Taking? Authorizing Provider  aspirin EC  81 MG tablet Take 81 mg by mouth daily.   Yes Historical Provider, MD  cholecalciferol (VITAMIN D) 1000 UNITS tablet Take 1,000 Units by mouth daily.   Yes Historical Provider, MD  ketorolac (TORADOL) 10 MG tablet Take 1 tablet (10 mg total) by mouth every 6 (six) hours as needed for pain. 08/24/12  Yes Temple Pacini, MD  lovastatin (MEVACOR) 20 MG tablet Take 20 mg by mouth at bedtime.   Yes Historical Provider, MD  Magnesium Hydroxide (MILK OF MAGNESIA PO) Take 2-4 tablets by mouth daily as needed (for constipation).   Yes Historical Provider, MD  oxyCODONE-acetaminophen (PERCOCET) 10-325 MG per tablet Take 1 tablet by mouth every 4 (four) hours as needed for pain. 08/22/12  Yes Cristi Loron, MD  valsartan-hydrochlorothiazide (DIOVAN-HCT) 320-12.5 MG per tablet Take 1 tablet by mouth daily.   Yes Historical Provider, MD  diazepam (VALIUM) 5 MG tablet Take 5 mg by mouth every 6 (six) hours as needed (for pain).    Historical Provider, MD  docusate sodium 100 MG CAPS Take 100 mg by mouth 2 (two) times daily. 08/22/12   Cristi Loron, MD     Review of Systems  Positive ROS: As above  All other systems have been reviewed and were otherwise negative with the exception of those mentioned in the HPI and as above.  Objective: Vital signs in last 24 hours: Temp:  [98 F (36.7 C)] 98 F (36.7 C) (05/19 1600) Pulse Rate:  [73] 73 (05/19 1600) Resp:  [  18] 18 (05/19 1600) BP: (159)/(60) 159/60 mmHg (05/19 1600) SpO2:  [96 %] 96 % (05/19 1600) Weight:  [86.183 kg (190 lb)] 86.183 kg (190 lb) (05/19 1545)  Physical exam:  Gen.: The patient is alert and pleasant. She does not appear ill.  Neurologic exam: The patient is alert and oriented. Her strength is normal in her bilateral lower extremities. Her sensation is normal.  Wound: The patient's incision is healing well without signs of infection, drainage, fluid collections, etc. Assessment/Plan: Headaches: The patient's headaches  clinically or just the opposite of what I would expect for a spinal fluid leak headache. I.e. her symptoms worsened upon lying down and improved if she sits or walks. I will admit her for observation. We will mobilize her with PT and OT. We discussed the alternative treatment option of an exploration of her wound but the patient declined. If her headaches continue we may need to run a myelogram.  Constipation: I will add as needed posture/enema  Nausea: I will add Zofran.   Cristi Loron 08/27/2012 6:58 PM

## 2012-08-28 NOTE — Progress Notes (Signed)
OT Cancellation Note  Patient Details Name: Shelby Newton MRN: [id number] DOB: [birth year]   Cancelled Treatment:    Reason Eval/Treat Not Completed: Fatigue/lethargy limiting ability to participate - pt requesting OT defer eval until tomorrow. Pt. Agrees to get back up later this pm.  Will re-attempt.  Jeani Hawking M [phone] 08/28/2012, 3:07 PM

## 2012-08-28 NOTE — Progress Notes (Signed)
Patient states she's not having any H/A at this time. However, she has a lost of appetite, feeling weak, and having frequent stools. Will continue to monitor. Burnett Corrente, RN

## 2012-08-28 NOTE — Evaluation (Signed)
Physical Therapy Evaluation Patient Details Name: Shelby Newton MRN: [id number] DOB: 11-Oct-1945 Today's Date: 08/28/2012 Time: 4098-1191 PT Time Calculation (min): 15 min  PT Assessment / Plan / Recommendation Clinical Impression  The patient is a 67 year old white female s/p L4-5 decompression, agitation, and fusion 1 week ago. During that surgery there was a incidental durotomy which was closed with 6-0 Prolene suture. The patient was subsequently discharged. Patient has had a headache since d/c and adm. by Dr. Lovell Sheehan for observation. Presents to PT this morning moving well and denies headache. Reinforced education. Discussed progression of ambulation off the walker to [facility]. Will benefit one more session from PT for gait training with SPC.     PT Assessment  Patient needs continued PT services    Follow Up Recommendations  No PT follow up    Does the patient have the potential to tolerate intense rehabilitation      Barriers to Discharge        Equipment Recommendations       Recommendations for Other Services     Frequency Min 3X/week    Precautions / Restrictions Precautions Precautions: Back Precaution Comments: Pt able to recite 3/3 back precautions Required Braces or Orthoses: Spinal Brace Spinal Brace: Applied in sitting position   Pertinent Vitals/Pain Denies pain      Mobility  Bed Mobility Bed Mobility: Not assessed Details for Bed Mobility Assistance: pt verbalized log roll, got up to the edge of the bed before I got into the room and was donning her brace sitting EOB with her husband Transfers Transfers: Sit to Stand;Stand to Sit Sit to Stand: 6: Modified independent (Device/Increase time);From bed;With upper extremity assist Stand to Sit: 6: Modified independent (Device/Increase time);To chair/3-in-1;With upper extremity assist Ambulation/Gait Ambulation/Gait Assistance: 6: Modified independent (Device/Increase time) Ambulation Distance (Feet): 200  Feet Assistive device: Rolling walker Gait Pattern: Step-through pattern Stairs: No (pt declined practicing stairs)         PT Diagnosis: Generalized weakness  PT Problem List: Decreased knowledge of use of DME PT Treatment Interventions:     PT Goals Acute Rehab PT Goals Pt will Ambulate: 51 - 150 feet;with least restrictive assistive device;with modified independence PT Goal: Ambulate - Progress: Goal set today  Visit Information  Last PT Received On: 08/28/12 Assistance Needed: +1    Subjective Data  Subjective: Im doing fine. I have no pain now.  Patient Stated Goal: home, walk without walker   Prior Functioning  Home Living Lives With: Spouse Available Help at Discharge: Family Type of Home: House Home Access: Stairs to enter Secretary/administrator of Steps: 3 Home Layout: Multi-level;1/2 bath on main level;Bed/bath upstairs Alternate Level Stairs-Number of Steps: standard flight 13-15 Alternate Level Stairs-Rails: Right Bathroom Shower/Tub: Walk-in shower Home Adaptive Equipment: Environmental consultant - rolling Prior Function Level of Independence: Independent with assistive device(s) Able to Take Stairs?: Yes Vocation: Retired Musician: No difficulties    Copywriter, advertising Arousal/Alertness: Awake/alert Behavior During Therapy: WFL for tasks assessed/performed Overall Cognitive Status: Within Functional Limits for tasks assessed    Extremity/Trunk Assessment Right Upper Extremity Assessment RUE ROM/Strength/Tone: Within functional levels RUE Sensation: WFL - Light Touch Left Upper Extremity Assessment LUE ROM/Strength/Tone: Within functional levels LUE Sensation: WFL - Light Touch Right Lower Extremity Assessment RLE ROM/Strength/Tone: Within functional levels RLE Sensation: WFL - Light Touch RLE Coordination: WFL - gross/fine motor Left Lower Extremity Assessment LLE ROM/Strength/Tone: Within functional levels LLE Sensation: WFL - Light  Touch LLE Coordination: WFL - gross/fine motor  Balance    End of Session PT - End of Session Equipment Utilized During Treatment: Gait belt Activity Tolerance: Patient tolerated treatment well Patient left: in chair;with call bell/phone within reach;with family/visitor present Nurse Communication: Mobility status  GP     [facility] HELEN 08/28/2012, 12:51 PM

## 2012-08-28 NOTE — Progress Notes (Signed)
Patient ID: Shelby Newton, female   DOB: Sep 13, 1945, 67 y.o.   MRN: [id number] Subjective:  The patient is alert and pleasant. She looks and feels much better. She denies headaches.  Objective: Vital signs in last 24 hours: Temp:  [98 F (36.7 C)-99.2 F (37.3 C)] 98 F (36.7 C) (05/20 0600) Pulse Rate:  [72-75] 72 (05/20 0600) Resp:  [18] 18 (05/20 0600) BP: (117-165)/(40-66) 165/66 mmHg (05/20 0600) SpO2:  [96 %-97 %] 96 % (05/20 0600) Weight:  [86.183 kg (190 lb)] 86.183 kg (190 lb) (05/19 1545)  Intake/Output from previous day:   Intake/Output this shift:    Physical exam the patient is alert and oriented. Her strength is normal in her lower extremities.  Lab Results: No results found for this basename: WBC, HGB, HCT, PLT,  in the last 72 hours BMET No results found for this basename: NA, K, CL, CO2, GLUCOSE, BUN, CREATININE, CALCIUM,  in the last 72 hours  Studies/Results: No results found.  Assessment/Plan: Headaches: Again her headaches are not classic for CSF fistula headaches as they improve with standing and walking. We will continue observation. If they're still question we may need to run a myelogram. We will mobilize her.  Dehydration/nausea: The patient has responded well to IV fluids.  Constipation: She has had a bowel movement this morning.  LOS: 1 day     Kenlynn Houde D 08/28/2012, 7:29 AM

## 2012-08-29 MED ORDER — IRBESARTAN 300 MG PO TABS
300.0000 mg | ORAL_TABLET | Freq: Every day | ORAL | Status: DC
  Administered 2012-08-29: 300 mg via ORAL
  Filled 2012-08-29 (×2): qty 1

## 2012-08-29 NOTE — Progress Notes (Signed)
PT Cancellation Note  Patient Details Name: Shelby Newton MRN: [id number] DOB: 1946-01-01   Cancelled Treatment:    Reason Eval/Treat Not Completed: Fatigue/lethargy limiting ability to participate. Pt requesting to rest until this pm. Informed her that I may not be able to return this afternoon and encouraged her to mobilize with her husband. Will try back as time allows.   [facility] HELEN 08/29/2012, 10:40 AM Pager: [phone]

## 2012-08-29 NOTE — Progress Notes (Addendum)
Pt and pt's husband refused bed alarm. Pt's husband signed fall safety sheet stating refusal to comply with bed alarm.

## 2012-08-29 NOTE — Progress Notes (Signed)
Physical Therapy Discharge Patient Details Name: Shelby Newton MRN: [id number] DOB: September 16, 1945 Today's Date: 08/29/2012 Time:  -     Patient discharged from PT services. Attempted cane training x2. Pt has changed her mind about using straight cane, decided to progress with RW. Feels comfortable ambulating with RW and encouraged to continue ambulation with husband and nursing staff.   Please see latest therapy progress note for current level of functioning and progress toward goals.    Progress and discharge plan discussed with patient and/or caregiver.  GP     Dessie Coma, PT Student  08/29/2012, 2:22 PM

## 2012-08-29 NOTE — Progress Notes (Signed)
Occupational Therapy Evaluation Patient Details Name: Shelby Newton MRN: [id number] DOB: November 23, 1945 Today's Date: 08/29/2012 Time: 4098-1191 OT Time Calculation (min): 15 min  OT Assessment / Plan / Recommendation Clinical Impression  Pt was very fatigued today.  Pt recalled 3/3 back precautions and was able to don brace independently (but husband said he would always help her) Pt's bed mobility is at a supervision level, as well as sit<>stand with RW to chair.  Pt wants to go home and get better. Pt eating at supervision level and is consuming food despite no appititte because "I know I need to eat something to get better"    OT Assessment  Patient needs continued OT Services    Follow Up Recommendations  Home health OT             Frequency  Min 2X/week    Precautions / Restrictions Precautions Precautions: Back Precaution Booklet Issued: Yes (comment) (on last admission) Precaution Comments: Pt able to recite 3/3 back precautions Required Braces or Orthoses: Spinal Brace Spinal Brace: Applied in sitting position Restrictions Weight Bearing Restrictions: No   Pertinent Vitals/Pain Pt reported more fatigue than pain.    ADL  Eating/Feeding: Modified independent Where Assessed - Eating/Feeding: Chair Toilet Transfer: Buyer, retail Method: Sit to Barista: Other (comment) (simulated by going to chair) Toileting - Clothing Manipulation and Hygiene: Simulated;Supervision/safety Where Assessed - Toileting Clothing Manipulation and Hygiene: Standing Equipment Used: Back brace;Rolling walker;Gait belt Transfers/Ambulation Related to ADLs: Pt amubulated with decreased gait speed with RW at modified independent level ADL Comments: Pt able to recall 3/3 back precautions and don brace independently (even though husband said he would always help her) Pt limited by fatigue.    OT Diagnosis: Generalized weakness;Acute pain   OT Problem List: Decreased activity tolerance;Decreased strength;Pain OT Treatment Interventions: Self-care/ADL training;DME and/or AE instruction;Therapeutic activities;Patient/family education;Balance training   OT Goals Acute Rehab OT Goals OT Goal Formulation: With patient/family Time For Goal Achievement: 09/12/12 Potential to Achieve Goals: Good ADL Goals Pt Will Perform Lower Body Bathing: with modified independence;Standing at sink;Sit to stand from chair ADL Goal: Lower Body Bathing - Progress: Goal set today Pt Will Perform Tub/Shower Transfer: with modified independence;Ambulation;with DME ADL Goal: Tub/Shower Transfer - Progress: Goal set today Miscellaneous OT Goals Miscellaneous OT Goal #1: Pt will complete grooming task maintaining back precautions MOD I with no cues OT Goal: Miscellaneous Goal #1 - Progress: Goal set today  Visit Information  Last OT Received On: 08/29/12    Subjective Data  Subjective: Pt had best friend at [facility] who had a histerectomy and they were supposed to recooperate together. Pt motivated to get home and enjoy movie time with her.   Prior Functioning     Home Living Lives With: Spouse Available Help at Discharge: Family Type of Home: House Home Access: Stairs to enter Secretary/administrator of Steps: 3 Entrance Stairs-Rails: None Home Layout: Multi-level;1/2 bath on main level;Bed/bath upstairs Alternate Level Stairs-Number of Steps: standard flight 13-15 Alternate Level Stairs-Rails: Right Bathroom Shower/Tub: Health visitor: Standard Home Adaptive Equipment: Environmental consultant - rolling Prior Function Level of Independence: Independent with assistive device(s) Able to Take Stairs?: Yes Driving: Yes Vocation: Retired Musician: No difficulties Dominant Hand: Right         Vision/Perception Vision - History Baseline Vision: Wears glasses all the time Patient Visual Report: No change from  baseline   Cognition  Cognition Arousal/Alertness: Awake/alert Behavior During Therapy: WFL for tasks assessed/performed Overall Cognitive  Status: Within Functional Limits for tasks assessed       Mobility Bed Mobility Bed Mobility: Rolling Right;Right Sidelying to Sit;Sitting - Scoot to [place] of Bed Rolling Right: 5: Supervision Right Sidelying to Sit: 5: Supervision;HOB flat Sitting - Scoot to Edge of Bed: 5: Supervision Details for Bed Mobility Assistance: Pt performed bed mobility, donned brace at EOB at supervision level from a 15degree incline Transfers Transfers: Sit to Stand;Stand to Sit Sit to Stand: 5: Supervision;With upper extremity assist;From bed Stand to Sit: 5: Supervision;With upper extremity assist;With armrests;To chair/3-in-1 Details for Transfer Assistance: safe mobility           End of Session OT - End of Session Equipment Utilized During Treatment: Gait belt;Back brace;Other (comment) (RW) Activity Tolerance: Patient limited by fatigue Patient left: in chair;with call bell/phone within reach;with family/visitor present Nurse Communication: Mobility status;Precautions  GO     Sherryl Manges 08/29/2012, 2:46 PM

## 2012-08-29 NOTE — Progress Notes (Signed)
I agree with the following treatment note after reviewing documentation.   Johnston, Erryn Dickison Brynn   OTR/L Pager: [phone] Office: [phone] .   

## 2012-08-29 NOTE — Progress Notes (Signed)
I have read and agree with the below discharge from acute therapy services.  Ivonne Andrew PT, DPT Pager: [phone]

## 2012-08-29 NOTE — Progress Notes (Signed)
Patient ID: Shelby Newton, female   DOB: 1945/04/30, 67 y.o.   MRN: [id number] Subjective:  The patient is alert and pleasant. She looks well. She complains of a mild maxillary sinus headache. She did not have a headache when she stood and walked yesterday.  Objective: Vital signs in last 24 hours: Temp:  [98.1 F (36.7 C)-100.1 F (37.8 C)] 98.7 F (37.1 C) (05/21 0551) Pulse Rate:  [59-76] 68 (05/21 0551) Resp:  [16-20] 18 (05/21 0551) BP: (155-181)/(49-68) 168/49 mmHg (05/21 0551) SpO2:  [94 %-97 %] 95 % (05/21 0551)  Intake/Output from previous day: 05/20 0701 - 05/21 0700 In: 225 [I.V.:225] Out: 2 [Urine:2] Intake/Output this shift:    Physical exam the patient is alert and oriented x3. Her wound is healing well without drainage, fluid collection, infection, etc. her strength is normal.  Lab Results: No results found for this basename: WBC, HGB, HCT, PLT,  in the last 72 hours BMET No results found for this basename: NA, K, CL, CO2, GLUCOSE, BUN, CREATININE, CALCIUM,  in the last 72 hours  Studies/Results: No results found.  Assessment/Plan: Dehydration: This seems to have resolved I will Hep-Lock her IV.  Headaches: These are not posturally related and are improving/resolving.  Constipation: Resolved   LOS: 2 days     Ingvald Theisen D 08/29/2012, 9:11 AM

## 2012-08-30 NOTE — Discharge Summary (Signed)
Physician Discharge Summary  Patient ID: Shelby Newton MRN: [id number] DOB/AGE: [birth year] 69 y.o.  Admit date: 08/27/2012 Discharge date: 08/30/2012  Admission Diagnoses: Headaches, dehydration, nausea, constipation  Discharge Diagnoses: The same Active Problems:   * No active hospital problems. *   Discharged Condition: good  Hospital Course: I admitted the patient to [facility] on 08/27/2012. We were initially concerned about a CSF fistula given her headaches. The patient's headaches, dehydration, nausea, constipation were treated with IV fluids, laxative, medications, etc. this all resolved.  On 08/30/2012 the patient looked and felt better. She requested discharge to home. She was given oral and written discharge instructions.  Consults: None Significant Diagnostic Studies: None Treatments: IV hydration, medication Discharge Exam: Blood pressure 169/66, pulse 65, temperature 98.2 F (36.8 C), temperature source Oral, resp. rate 18, height 5\' 3"  (1.6 m), weight 86.183 kg (190 lb), SpO2 95.00%. The patient is alert and oriented. She looks well. Her strength is normal. Her wound is healing well without discharge or signs of infection.  Disposition: Home  Discharge Orders   Future Orders Complete By Expires     Call MD for:  difficulty breathing, headache or visual disturbances  As directed     Call MD for:  extreme fatigue  As directed     Call MD for:  hives  As directed     Call MD for:  persistant dizziness or light-headedness  As directed     Call MD for:  persistant nausea and vomiting  As directed     Call MD for:  redness, tenderness, or signs of infection (pain, swelling, redness, odor or green/yellow discharge around incision site)  As directed     Call MD for:  severe uncontrolled pain  As directed     Call MD for:  temperature >100.4  As directed     Diet - low sodium heart healthy  As directed     Discharge instructions  As directed     Comments:      Call [phone] for a followup appointment. Take a stool softener while you are using pain medications.    Driving Restrictions  As directed     Comments:      Do not drive for 2 weeks.    Increase activity slowly  As directed     Lifting restrictions  As directed     Comments:      Do not lift more than 5 pounds. No excessive bending or twisting.    May shower / Bathe  As directed     Comments:      He may shower after the pain she is removed 3 days after surgery. Leave the incision alone.    No dressing needed  As directed         Medication List    TAKE these medications       aspirin EC 81 MG tablet  Take 81 mg by mouth daily.     cholecalciferol 1000 UNITS tablet  Commonly known as:  VITAMIN D  Take 1,000 Units by mouth daily.     diazepam 5 MG tablet  Commonly known as:  VALIUM  Take 5 mg by mouth every 6 (six) hours as needed (for pain).     DSS 100 MG Caps  Take 100 mg by mouth 2 (two) times daily.     ketorolac 10 MG tablet  Commonly known as:  TORADOL  Take 1 tablet (10 mg total) by mouth every  6 (six) hours as needed for pain.     lovastatin 20 MG tablet  Commonly known as:  MEVACOR  Take 20 mg by mouth at bedtime.     MILK OF MAGNESIA PO  Take 2-4 tablets by mouth daily as needed (for constipation).     oxyCODONE-acetaminophen 10-325 MG per tablet  Commonly known as:  PERCOCET  Take 1 tablet by mouth every 4 (four) hours as needed for pain.     valsartan-hydrochlorothiazide 320-12.5 MG per tablet  Commonly known as:  DIOVAN-HCT  Take 1 tablet by mouth daily.         SignedCristi Loron 08/30/2012, 8:04 AM

## 2012-08-30 NOTE — Care Management Note (Signed)
    Page 1 of 1   08/30/2012     1:28:33 PM   CARE MANAGEMENT NOTE 08/30/2012  Patient:  Shelby Newton, Shelby Newton   Account Number:  [id number]  Date Initiated:  08/28/2012  Documentation initiated by:  [facility]  Subjective/Objective Assessment:   admitted with headaches, nausea     Action/Plan:   PT/OT evals-no follow up   Anticipated DC Date:  08/31/2012   Anticipated DC Plan:  HOME/SELF CARE      DC Planning Services  CM consult      Choice offered to / List presented to:             Status of service:  Completed, signed off Medicare Important Message given?   (If response is "NO", the following Medicare IM given date fields will be blank) Date Medicare IM given:   Date Additional Medicare IM given:    Discharge Disposition:  HOME/SELF CARE  Per UR Regulation:  Reviewed for med. necessity/level of care/duration of stay  If discussed at Long Length of Stay Meetings, dates discussed:    Comments:

## 2012-09-21 ENCOUNTER — Other Ambulatory Visit: Payer: Self-pay | Admitting: Neurosurgery

## 2012-09-21 ENCOUNTER — Encounter (HOSPITAL_COMMUNITY): Payer: Self-pay | Admitting: Pharmacy Technician

## 2012-09-25 ENCOUNTER — Encounter (HOSPITAL_COMMUNITY): Payer: Self-pay

## 2012-09-25 ENCOUNTER — Encounter (HOSPITAL_COMMUNITY)
Admission: RE | Admit: 2012-09-25 | Discharge: 2012-09-25 | Disposition: A | Discharge status: Home / Self Care | Payer: Medicare Other | Source: Ambulatory Visit | Attending: Neurosurgery | Admitting: Neurosurgery

## 2012-09-25 LAB — BASIC METABOLIC PANEL
BUN: 18 mg/dL (ref 6–23)
CO2: 25 mEq/L (ref 19–32)
Calcium: 10.1 mg/dL (ref 8.4–10.5)
Chloride: 102 mEq/L (ref 96–112)
Creatinine, Ser: 0.85 mg/dL (ref 0.50–1.10)
GFR calc Af Amer: 80 mL/min — ABNORMAL LOW (ref >90)
GFR calc non Af Amer: 69 mL/min — ABNORMAL LOW (ref >90)
Glucose, Bld: 124 mg/dL — ABNORMAL HIGH (ref 70–99)
Potassium: 4 mEq/L (ref 3.5–5.1)
Sodium: 141 mEq/L (ref 135–145)

## 2012-09-25 LAB — CBC
HCT: 38.5 % (ref 36.0–46.0)
Hemoglobin: 12.6 g/dL (ref 12.0–15.0)
MCH: 28.4 pg (ref 26.0–34.0)
MCHC: 32.7 g/dL (ref 30.0–36.0)
MCV: 86.9 fL (ref 78.0–100.0)
Platelets: 295 10*3/uL (ref 150–400)
RBC: 4.43 MIL/uL (ref 3.87–5.11)
RDW: 13.3 % (ref 11.5–15.5)
WBC: 10.6 10*3/uL — ABNORMAL HIGH (ref 4.0–10.5)

## 2012-09-25 LAB — SURGICAL PCR SCREEN
MRSA, PCR: NEGATIVE
Staphylococcus aureus: NEGATIVE

## 2012-09-25 MED ORDER — CEFAZOLIN SODIUM-DEXTROSE 2-3 GM-% IV SOLR
2.0000 g | INTRAVENOUS | Status: AC
  Administered 2012-09-26: 2 g via INTRAVENOUS
  Filled 2012-09-25: qty 50

## 2012-09-25 NOTE — Pre-Procedure Instructions (Signed)
[name]  09/25/2012    Your procedure is scheduled on:  Wednesday September 26, 2012  Report to [facility] at 2:00 PM.  Call this number if you have problems the morning of surgery: [phone]   Remember:   Do not eat food or drink liquids after midnight Tuesday   Take these medicines the morning of surgery with A SIP OF WATER: None   Do not wear jewelry, make-up or nail polish.  Do not wear lotions, powders, or perfumes. You may wear deodorant.  Do not shave 48 hours prior to surgery.   Do not bring valuables to the hospital.  [facility] is not responsible                   for any belongings or valuables.  Contacts, dentures or bridgework may not be worn into surgery.  Leave suitcase in the car. After surgery it may be brought to your room.  For patients admitted to the hospital, checkout time is 11:00 AM the day of  discharge.   Patients discharged the day of surgery will not be allowed to drive  home.    Special Instructions: Shower using CHG 2 nights before surgery and the night before surgery.  If you shower the day of surgery use CHG.  Use special wash - you have one bottle of CHG for all showers.  You should use approximately 1/3 of the bottle for each shower.   Please read over the following fact sheets that you were given: Coughing and Deep Breathing, MRSA Information and Surgical Site Infection Prevention

## 2012-09-26 ENCOUNTER — Encounter (HOSPITAL_COMMUNITY): Payer: Self-pay | Admitting: Anesthesiology

## 2012-09-26 ENCOUNTER — Encounter (HOSPITAL_COMMUNITY)
Admission: RE | Disposition: A | Discharge status: Home / Self Care | Payer: Self-pay | Source: Ambulatory Visit | Attending: Neurosurgery

## 2012-09-26 ENCOUNTER — Inpatient Hospital Stay (HOSPITAL_COMMUNITY): Payer: Medicare Other | Admitting: Anesthesiology

## 2012-09-26 ENCOUNTER — Encounter (HOSPITAL_COMMUNITY): Payer: Self-pay | Admitting: *Deleted

## 2012-09-26 ENCOUNTER — Inpatient Hospital Stay (HOSPITAL_COMMUNITY)
Admission: RE | Admit: 2012-09-26 | Discharge: 2012-09-29 | DRG: 030 | Disposition: A | Discharge status: Home / Self Care | Payer: Medicare Other | Source: Ambulatory Visit | Attending: Neurosurgery | Admitting: Neurosurgery

## 2012-09-26 DIAGNOSIS — I10 Essential (primary) hypertension: Secondary | ICD-10-CM | POA: Diagnosis present

## 2012-09-26 DIAGNOSIS — E669 Obesity, unspecified: Secondary | ICD-10-CM | POA: Diagnosis present

## 2012-09-26 DIAGNOSIS — Z01812 Encounter for preprocedural laboratory examination: Secondary | ICD-10-CM

## 2012-09-26 DIAGNOSIS — Z79899 Other long term (current) drug therapy: Secondary | ICD-10-CM

## 2012-09-26 DIAGNOSIS — G96198 Other disorders of meninges, not elsewhere classified: Secondary | ICD-10-CM | POA: Diagnosis present

## 2012-09-26 DIAGNOSIS — Y831 Surgical operation with implant of artificial internal device as the cause of abnormal reaction of the patient, or of later complication, without mention of misadventure at the time of the procedure: Secondary | ICD-10-CM | POA: Diagnosis present

## 2012-09-26 DIAGNOSIS — K219 Gastro-esophageal reflux disease without esophagitis: Secondary | ICD-10-CM | POA: Diagnosis present

## 2012-09-26 DIAGNOSIS — G969 Disorder of central nervous system, unspecified: Principal | ICD-10-CM | POA: Diagnosis present

## 2012-09-26 DIAGNOSIS — Z7982 Long term (current) use of aspirin: Secondary | ICD-10-CM

## 2012-09-26 SURGERY — REPAIR OF CEREBROSPINAL FLUID LEAK
Anesthesia: General | Site: Back | Wound class: Clean

## 2012-09-26 MED ORDER — LACTATED RINGERS IV SOLN
INTRAVENOUS | Status: DC
  Administered 2012-09-26: 21:00:00 via INTRAVENOUS

## 2012-09-26 MED ORDER — PHENOL 1.4 % MT LIQD: 1.0000 | OROMUCOSAL | Status: DC | PRN

## 2012-09-26 MED ORDER — HYDROCHLOROTHIAZIDE 12.5 MG PO CAPS
12.5000 mg | ORAL_CAPSULE | Freq: Every day | ORAL | Status: DC
  Administered 2012-09-27 – 2012-09-28 (×2): 12.5 mg via ORAL
  Filled 2012-09-26 (×4): qty 1

## 2012-09-26 MED ORDER — PHENYLEPHRINE HCL 10 MG/ML IJ SOLN
INTRAMUSCULAR | Status: DC | PRN
  Administered 2012-09-26 (×4): 40 ug via INTRAVENOUS

## 2012-09-26 MED ORDER — LIDOCAINE HCL (CARDIAC) 20 MG/ML IV SOLN
INTRAVENOUS | Status: DC | PRN
  Administered 2012-09-26: 80 mg via INTRAVENOUS

## 2012-09-26 MED ORDER — DEXAMETHASONE SODIUM PHOSPHATE 10 MG/ML IJ SOLN
INTRAMUSCULAR | Status: DC | PRN
  Administered 2012-09-26: 8 mg via INTRAVENOUS

## 2012-09-26 MED ORDER — ROCURONIUM BROMIDE 100 MG/10ML IV SOLN
INTRAVENOUS | Status: DC | PRN
  Administered 2012-09-26: 50 mg via INTRAVENOUS

## 2012-09-26 MED ORDER — THROMBIN 5000 UNITS EX SOLR
CUTANEOUS | Status: DC | PRN
  Administered 2012-09-26 (×2): 5000 [IU] via TOPICAL

## 2012-09-26 MED ORDER — VALSARTAN-HYDROCHLOROTHIAZIDE 320-12.5 MG PO TABS: 1.0000 | ORAL_TABLET | Freq: Every day | ORAL | Status: DC

## 2012-09-26 MED ORDER — LACTATED RINGERS IV SOLN
INTRAVENOUS | Status: DC | PRN
  Administered 2012-09-26 (×2): via INTRAVENOUS

## 2012-09-26 MED ORDER — HYDROMORPHONE HCL PF 1 MG/ML IJ SOLN: 0.2500 mg | INTRAMUSCULAR | Status: DC | PRN

## 2012-09-26 MED ORDER — 0.9 % SODIUM CHLORIDE (POUR BTL) OPTIME
TOPICAL | Status: DC | PRN
  Administered 2012-09-26: 1000 mL

## 2012-09-26 MED ORDER — ALUM & MAG HYDROXIDE-SIMETH 200-200-20 MG/5ML PO SUSP: 30.0000 mL | Freq: Four times a day (QID) | ORAL | Status: DC | PRN

## 2012-09-26 MED ORDER — GLYCOPYRROLATE 0.2 MG/ML IJ SOLN
INTRAMUSCULAR | Status: DC | PRN
  Administered 2012-09-26: 0.6 mg via INTRAVENOUS
  Administered 2012-09-26: 0.2 mg via INTRAVENOUS

## 2012-09-26 MED ORDER — BACITRACIN ZINC 500 UNIT/GM EX OINT
TOPICAL_OINTMENT | CUTANEOUS | Status: DC | PRN
  Administered 2012-09-26: 1 via TOPICAL

## 2012-09-26 MED ORDER — FENTANYL CITRATE 0.05 MG/ML IJ SOLN: 50.0000 ug | Freq: Once | INTRAMUSCULAR | Status: DC

## 2012-09-26 MED ORDER — ARTIFICIAL TEARS OP OINT
TOPICAL_OINTMENT | OPHTHALMIC | Status: DC | PRN
  Administered 2012-09-26: 1 via OPHTHALMIC

## 2012-09-26 MED ORDER — ZOLPIDEM TARTRATE 5 MG PO TABS: 5.0000 mg | ORAL_TABLET | Freq: Every evening | ORAL | Status: DC | PRN

## 2012-09-26 MED ORDER — SODIUM CHLORIDE 0.9 % IR SOLN
Status: DC | PRN
  Administered 2012-09-26: 15:00:00

## 2012-09-26 MED ORDER — DOCUSATE SODIUM 100 MG PO CAPS
100.0000 mg | ORAL_CAPSULE | Freq: Two times a day (BID) | ORAL | Status: DC
  Administered 2012-09-27 – 2012-09-28 (×4): 100 mg via ORAL
  Filled 2012-09-26 (×4): qty 1

## 2012-09-26 MED ORDER — MIDAZOLAM HCL 2 MG/2ML IJ SOLN: 1.0000 mg | INTRAMUSCULAR | Status: DC | PRN

## 2012-09-26 MED ORDER — SIMVASTATIN 10 MG PO TABS
10.0000 mg | ORAL_TABLET | Freq: Every day | ORAL | Status: DC
  Administered 2012-09-27 – 2012-09-28 (×2): 10 mg via ORAL
  Filled 2012-09-26 (×3): qty 1

## 2012-09-26 MED ORDER — MORPHINE SULFATE 2 MG/ML IJ SOLN: 1.0000 mg | INTRAMUSCULAR | Status: DC | PRN

## 2012-09-26 MED ORDER — SODIUM CHLORIDE 0.9 % IV SOLN
INTRAVENOUS | Status: AC
  Filled 2012-09-26: qty 500

## 2012-09-26 MED ORDER — MIDAZOLAM HCL 5 MG/5ML IJ SOLN
INTRAMUSCULAR | Status: DC | PRN
  Administered 2012-09-26: 2 mg via INTRAVENOUS

## 2012-09-26 MED ORDER — IRBESARTAN 300 MG PO TABS
300.0000 mg | ORAL_TABLET | Freq: Every day | ORAL | Status: DC
  Administered 2012-09-27 – 2012-09-28 (×2): 300 mg via ORAL
  Filled 2012-09-26 (×4): qty 1

## 2012-09-26 MED ORDER — PROPOFOL 10 MG/ML IV BOLUS
INTRAVENOUS | Status: DC | PRN
  Administered 2012-09-26: 180 mg via INTRAVENOUS

## 2012-09-26 MED ORDER — DIAZEPAM 5 MG PO TABS
5.0000 mg | ORAL_TABLET | Freq: Four times a day (QID) | ORAL | Status: DC | PRN
  Administered 2012-09-27 – 2012-09-28 (×4): 5 mg via ORAL
  Filled 2012-09-26 (×4): qty 1

## 2012-09-26 MED ORDER — FENTANYL CITRATE 0.05 MG/ML IJ SOLN
INTRAMUSCULAR | Status: DC | PRN
  Administered 2012-09-26 (×3): 50 ug via INTRAVENOUS
  Administered 2012-09-26: 100 ug via INTRAVENOUS

## 2012-09-26 MED ORDER — CEFAZOLIN SODIUM-DEXTROSE 2-3 GM-% IV SOLR
2.0000 g | Freq: Three times a day (TID) | INTRAVENOUS | Status: AC
  Administered 2012-09-26 – 2012-09-27 (×2): 2 g via INTRAVENOUS
  Filled 2012-09-26 (×2): qty 50

## 2012-09-26 MED ORDER — PROMETHAZINE HCL 25 MG/ML IJ SOLN: 6.2500 mg | INTRAMUSCULAR | Status: DC | PRN

## 2012-09-26 MED ORDER — HEMOSTATIC AGENTS (NO CHARGE) OPTIME
TOPICAL | Status: DC | PRN
  Administered 2012-09-26: 1 via TOPICAL

## 2012-09-26 MED ORDER — ONDANSETRON HCL 4 MG/2ML IJ SOLN: 4.0000 mg | INTRAMUSCULAR | Status: DC | PRN

## 2012-09-26 MED ORDER — MENTHOL 3 MG MT LOZG: 1.0000 | LOZENGE | OROMUCOSAL | Status: DC | PRN

## 2012-09-26 MED ORDER — HYDROMORPHONE HCL 2 MG PO TABS: 4.0000 mg | ORAL_TABLET | ORAL | Status: DC | PRN

## 2012-09-26 MED ORDER — BACITRACIN 50000 UNITS IM SOLR
INTRAMUSCULAR | Status: AC
  Filled 2012-09-26: qty 1

## 2012-09-26 MED ORDER — ONDANSETRON HCL 4 MG/2ML IJ SOLN
INTRAMUSCULAR | Status: DC | PRN
  Administered 2012-09-26: 4 mg via INTRAVENOUS

## 2012-09-26 MED ORDER — NEOSTIGMINE METHYLSULFATE 1 MG/ML IJ SOLN
INTRAMUSCULAR | Status: DC | PRN
  Administered 2012-09-26: 4 mg via INTRAVENOUS

## 2012-09-26 SURGICAL SUPPLY — 52 items
APL SKNCLS STERI-STRIP NONHPOA (GAUZE/BANDAGES/DRESSINGS) ×1
APL SRG 60D 8 XTD TIP BNDBL (TIP) ×1
BAG DECANTER FOR FLEXI CONT (MISCELLANEOUS) ×2 IMPLANT
BENZOIN TINCTURE PRP APPL 2/3 (GAUZE/BANDAGES/DRESSINGS) ×2 IMPLANT
BLADE SURG ROTATE 9660 (MISCELLANEOUS) IMPLANT
BUR MATCHSTICK NEURO 3.0 LAGG (BURR) ×1 IMPLANT
CANISTER SUCTION 2500CC (MISCELLANEOUS) ×2 IMPLANT
CLOTH BEACON ORANGE TIMEOUT ST (SAFETY) ×2 IMPLANT
CONT SPEC 4OZ CLIKSEAL STRL BL (MISCELLANEOUS) IMPLANT
DRAPE LAPAROTOMY 100X72X124 (DRAPES) ×2 IMPLANT
DRAPE POUCH INSTRU U-SHP 10X18 (DRAPES) ×2 IMPLANT
DRAPE SURG 17X23 STRL (DRAPES) ×8 IMPLANT
DURASEAL APPLICATOR TIP (TIP) ×1 IMPLANT
DURASEAL SPINE SEALANT 3ML (MISCELLANEOUS) ×1 IMPLANT
ELECT BLADE 4.0 EZ CLEAN MEGAD (MISCELLANEOUS) ×2
ELECT REM PT RETURN 9FT ADLT (ELECTROSURGICAL) ×2
ELECTRODE BLDE 4.0 EZ CLN MEGD (MISCELLANEOUS) IMPLANT
ELECTRODE REM PT RTRN 9FT ADLT (ELECTROSURGICAL) ×1 IMPLANT
GAUZE SPONGE 4X4 16PLY XRAY LF (GAUZE/BANDAGES/DRESSINGS) IMPLANT
GLOVE BIO SURGEON STRL SZ8.5 (GLOVE) ×2 IMPLANT
GLOVE BIOGEL PI IND STRL 7.5 (GLOVE) IMPLANT
GLOVE BIOGEL PI INDICATOR 7.5 (GLOVE) ×4
GLOVE ECLIPSE 6.5 STRL STRAW (GLOVE) ×1 IMPLANT
GLOVE ECLIPSE 7.5 STRL STRAW (GLOVE) ×3 IMPLANT
GLOVE EXAM NITRILE LRG STRL (GLOVE) IMPLANT
GLOVE EXAM NITRILE MD LF STRL (GLOVE) IMPLANT
GLOVE EXAM NITRILE XL STR (GLOVE) IMPLANT
GLOVE EXAM NITRILE XS STR PU (GLOVE) IMPLANT
GLOVE SS BIOGEL STRL SZ 8 (GLOVE) ×1 IMPLANT
GLOVE SUPERSENSE BIOGEL SZ 8 (GLOVE) ×1
GOWN BRE IMP SLV AUR LG STRL (GOWN DISPOSABLE) ×1 IMPLANT
GOWN BRE IMP SLV AUR XL STRL (GOWN DISPOSABLE) ×3 IMPLANT
KIT BASIN OR (CUSTOM PROCEDURE TRAY) ×2 IMPLANT
KIT ROOM TURNOVER OR (KITS) ×2 IMPLANT
NEEDLE HYPO 22GX1.5 SAFETY (NEEDLE) IMPLANT
NS IRRIG 1000ML POUR BTL (IV SOLUTION) ×2 IMPLANT
PACK LAMINECTOMY NEURO (CUSTOM PROCEDURE TRAY) ×2 IMPLANT
PAD ARMBOARD 7.5X6 YLW CONV (MISCELLANEOUS) ×6 IMPLANT
SPONGE GAUZE 4X4 12PLY (GAUZE/BANDAGES/DRESSINGS) ×2 IMPLANT
STRIP CLOSURE SKIN 1/2X4 (GAUZE/BANDAGES/DRESSINGS) ×2 IMPLANT
SUT ETHILON 3 0 FSL (SUTURE) ×1 IMPLANT
SUT PROLENE 6 0 BV (SUTURE) ×2 IMPLANT
SUT VIC AB 1 CT1 18XBRD ANBCTR (SUTURE) ×1 IMPLANT
SUT VIC AB 1 CT1 8-18 (SUTURE) ×4
SUT VIC AB 2-0 CP2 18 (SUTURE) ×2 IMPLANT
SWAB CULTURE LIQ STUART DBL (MISCELLANEOUS) IMPLANT
SYR 20ML ECCENTRIC (SYRINGE) ×2 IMPLANT
TAPE CLOTH SURG 4X10 WHT LF (GAUZE/BANDAGES/DRESSINGS) ×1 IMPLANT
TOWEL OR 17X24 6PK STRL BLUE (TOWEL DISPOSABLE) ×2 IMPLANT
TOWEL OR 17X26 10 PK STRL BLUE (TOWEL DISPOSABLE) ×2 IMPLANT
TUBE ANAEROBIC SPECIMEN COL (MISCELLANEOUS) IMPLANT
WATER STERILE IRR 1000ML POUR (IV SOLUTION) ×2 IMPLANT

## 2012-09-26 NOTE — Anesthesia Preprocedure Evaluation (Signed)
Anesthesia Evaluation  Patient identified by MRN, date of birth, ID band Patient awake    Reviewed: Allergy & Precautions, H&P , NPO status , Patient's Chart, lab work & pertinent test results  History of Anesthesia Complications (+) PONV  Airway Mallampati: II TM Distance: >3 FB Neck ROM: Full    Dental  (+) Dental Advisory Given   Pulmonary neg pulmonary ROS,  breath sounds clear to auscultation        Cardiovascular hypertension, Pt. on medications Rhythm:Regular Rate:Normal     Neuro/Psych    GI/Hepatic Neg liver ROS, GERD-  Medicated and Controlled,  Endo/Other  negative endocrine ROS  Renal/GU      Musculoskeletal   Abdominal (+) + obese,   Peds  Hematology   Anesthesia Other Findings   Reproductive/Obstetrics                           Anesthesia Physical Anesthesia Plan  ASA: II  Anesthesia Plan: General   Post-op Pain Management:    Induction: Intravenous  Airway Management Planned: Oral ETT  Additional Equipment:   Intra-op Plan:   Post-operative Plan: Extubation in OR  Informed Consent: I have reviewed the patients History and Physical, chart, labs and discussed the procedure including the risks, benefits and alternatives for the proposed anesthesia with the patient or authorized representative who has indicated his/her understanding and acceptance.     Plan Discussed with: CRNA and Surgeon  Anesthesia Plan Comments:         Anesthesia Quick Evaluation

## 2012-09-26 NOTE — H&P (Signed)
Subjective: The patient is a 67 year old white female who I performed a L4-5 decompression and fusion on several weeks ago. At surgery there was an incidental durotomy which was suture repaired. Patient has developed recurrent postural headaches. I discussed the situation with the patient and her husband. I recommend she undergo exploration of her lumbar wound with probable repair of CSF fistula and possible placement of lumbar drain. The patient has weighed the risks, benefits, and alternatives surgery and decided proceed with the operation.   Past Medical History  Diagnosis Date  . H/O seasonal allergies   . GERD (gastroesophageal reflux disease)     TAKES ZANTAC PRIOR TO TOMATOES  . 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 Hypertension     ON MEDS 10-12 YRS NOW    Past Surgical History  Procedure Laterality Date  . Appendectomy    . Abdominal hysterectomy    . Transforaminal lumbar interbody fusion (tlif) with pedicle screw fixation 1 level N/A 08/20/2012    Procedure: TRANSFORAMINAL LUMBAR INTERBODY FUSION (TLIF) WITH PEDICLE SCREW FIXATION ONE LEVEL;  Surgeon: Cristi Loron, MD;  Location: [facility];  Service: Neurosurgery;  Laterality: N/A;  Posteior Lumbar Four-Five Transforaminal Interbody and Fusion with Interbody Prothesis, Posterolateral Arthrodesis, and Posterior Nonsegmental Instrumentation    Allergies  Allergen Reactions  . Sulfa Antibiotics Anaphylaxis  . Macrodantin (Nitrofurantoin Macrocrystal) Hives, Itching and Swelling  . Vicodin (Hydrocodone-Acetaminophen) Rash  . Yellow Dyes (Non-Tartrazine) Itching and Rash    Certain types of yellow dye, have not found specific type    History  Substance Use Topics  . Smoking status: Never Smoker   . Smokeless tobacco: Never Used  . Alcohol Use: No     Comment: RARE GLASS OF WINE    History reviewed. No pertinent family history. Prior to Admission medications   Medication Sig Start Date End Date Taking? Authorizing  Provider  acetaminophen (TYLENOL) 500 MG tablet Take 1,000 mg by mouth every 6 (six) hours as needed for pain.   Yes Historical Provider, MD  aspirin EC 81 MG tablet Take 81 mg by mouth daily.   Yes Historical Provider, MD  cholecalciferol (VITAMIN D) 1000 UNITS tablet Take 1,000 Units by mouth daily.   Yes Historical Provider, MD  diazepam (VALIUM) 5 MG tablet Take 5 mg by mouth 2 (two) times daily.   Yes Historical Provider, MD  lovastatin (MEVACOR) 20 MG tablet Take 20 mg by mouth at bedtime.   Yes Historical Provider, MD  OVER THE COUNTER MEDICATION Take 1 tablet by mouth daily. Stool softerner   Yes Historical Provider, MD  valsartan-hydrochlorothiazide (DIOVAN-HCT) 320-12.5 MG per tablet Take 1 tablet by mouth daily.   Yes Historical Provider, MD     Review of Systems  Positive ROS: As above the patient complains of headaches  All other systems have been reviewed and were otherwise negative with the exception of those mentioned in the HPI and as above.  Objective: Vital signs in last 24 hours: Temp:  [97.6 F (36.4 C)] 97.6 F (36.4 C) (06/18 1233) Pulse Rate:  [92] 92 (06/18 1233) Resp:  [18] 18 (06/18 1233) BP: (141)/(87) 141/87 mmHg (06/18 1233) SpO2:  [95 %] 95 % (06/18 1233)  General Appearance: Alert, cooperative, no distress, appears stated age Head: Normocephalic, without obvious abnormality, atraumatic Eyes: PERRL, conjunctiva/corneas clear, EOM's intact, fundi benign, both eyes      Ears: Normal TM's and external ear canals, both ears Throat: Lips, mucosa, and tongue  normal; teeth and gums normal Neck: Supple, symmetrical, trachea midline, no adenopathy; thyroid: No enlargement/tenderness/nodules; no carotid bruit or JVD Back: Symmetric, no curvature, ROM normal, no CVA tenderness. The patient's lumbar wound has a subcutaneous fluid collection. There is no drainage or signs of infection. Lungs: Clear to auscultation bilaterally, respirations unlabored Heart: Regular  rate and rhythm, S1 and S2 normal, no murmur, rub or gallop Abdomen: Soft, non-tender, bowel sounds active all four quadrants, no masses, no organomegaly Extremities: Extremities normal, atraumatic, no cyanosis or edema Pulses: 2+ and symmetric all extremities Skin: Skin color, texture, turgor normal, no rashes or lesions  NEUROLOGIC:   Mental status: alert and oriented, no aphasia, good attention span, Fund of knowledge/ memory ok Motor Exam - grossly normal Sensory Exam - grossly normal Reflexes:  Coordination - grossly normal Gait - grossly normal Balance - grossly normal Cranial Nerves: I: smell Not tested  II: visual acuity  OS: Normal    OD: Normal   II: visual fields Full to confrontation  II: pupils Equal, round, reactive to light  III,VII: ptosis None  III,IV,VI: extraocular muscles  Full ROM  V: mastication Normal  V: facial light touch sensation  Normal  V,VII: corneal reflex  Present  VII: facial muscle function - upper  Normal  VII: facial muscle function - lower Normal  VIII: hearing Not tested  IX: soft palate elevation  Normal  IX,X: gag reflex Present  XI: trapezius strength  5/5  XI: sternocleidomastoid strength 5/5  XI: neck flexion strength  5/5  XII: tongue strength  Normal    Data Review Lab Results  Component Value Date   WBC 10.6* 09/25/2012   HGB 12.6 09/25/2012   HCT 38.5 09/25/2012   MCV 86.9 09/25/2012   PLT 295 09/25/2012   Lab Results  Component Value Date   NA 141 09/25/2012   K 4.0 09/25/2012   CL 102 09/25/2012   CO2 25 09/25/2012   BUN 18 09/25/2012   CREATININE 0.85 09/25/2012   GLUCOSE 124* 09/25/2012   Lab Results  Component Value Date   INR 0.92 08/17/2012    Assessment/Plan: Pseudomeningocele: I discussed the situation with the patient and her husband. We have discussed the various treatment options. I have recommended she undergo an exploration of her lumbar wound with probable repair of CSF fistula and possible placement of  lumbar drain. I described the surgery to them. I have discussed the risks, benefits, alternatives to surgery as well as likelihood of achieving our goals with surgery. I've answered all the patient and her husband's questions. The patient has decided proceed with the operation.   Cristi Loron 09/26/2012 3:49 PM

## 2012-09-26 NOTE — Op Note (Signed)
Brief history: The patient is a 67 year old white female who I performed at L4-5 decompression and fusion on about 4 weeks ago. At surgery there was an incidental durotomy. This was suture repaired. The patient was subsequently discharged but has developed postural headaches in the subcutaneous fluid collection. I recommended surgery to explore the wound and repair a presumed pseudomeningocele.  Preop diagnosis: Pseudomeningocele  Postop diagnosis: Same  Procedure: Exploration of lumbar wound with repair of pseudomeningocele  Surgeon: Dr. Delma Officer  Asst. Dr. Barbaraann Barthel  Anesthesia: Gen. endotracheal  Estimated blood loss: Minimal  Specimens: None  Drains: None  Complications: None  Description of procedure: The patient was brought to the operating room by the anesthesia team. General endotracheal anesthesia was induced. The patient was turned to the prone position on the Wilson frame. Her lumbosacral region was then prepared with Betadine scrub and Betadine solution. Sterile drapes were applied. I then incised the patient's fresh surgical scar with the scalpel. I immediately encountered subcutaneous clear fluid consistent with cerebral spinal fluid. I inserted the cerebellar retractor for exposure. There was a partial dehiscence of the patient's thoracolumbar fascia. I cut the Vicryl sutures using the Metzenbaum scissors. There was a tach down to the right laminotomy site. We inserted the [facility] retractor for exposure. I inspected the duraseal in the epidural space and remove it with suction. This exposed the dura. I could see the sutures from the old durotomy closure near the exit of the L5 nerve root. This durotomy closure looked good i.e. I did not see any leakage from the site. I then inspected around the bony edges. I encountered a second durotomy medial and just under the spinous processes/lamina of L5, just to the right of midline. There was spinal fluid leaking from this  durotomy. I used the drill and the Kerrison punches to remove some of the bone to get better access to the durotomy. I then closed with 3 figure-of-eight 6-0 Prolene sutures. We got a good closure with minimal leakage from the suture needle puncture sites. I placed some Gelfoam over this and irrigated the wound out. I then removed the Gelfoam and had anesthesia Valsalva the patient. There was no CSF leakage at this point. I then placed fresh DuraSeal over the exposed dura on the right. We then removed the retractor and reapproximated patient's thoracolumbar fascia with interrupted #1 Vicryl suture. I reapproximated the subcutaneous tissue with interrupted 2-0 Vicryl suture. I reapproximated the skin with a running 3-0 nylon suture. The wound was then coated with bacitracin ointment. A sterile dressing was applied. The drapes were removed. And the patient was subsequently returned to the supine position where she was extubated by the anesthesia team. The patient was transported to the post anesthesia care he in stable condition. By report all sponge instrument and needle counts were correct at the end of this case.

## 2012-09-26 NOTE — Progress Notes (Signed)
Patient ID: Shelby Newton, female   DOB: 05-06-1945, 67 y.o.   MRN: [id number] Subjective:  The patient is somnolent but easily arousable. She is in no apparent distress.  Objective: Vital signs in last 24 hours: Temp:  [97.4 F (36.3 C)-97.6 F (36.4 C)] 97.4 F (36.3 C) (06/18 1805) Pulse Rate:  [92] 92 (06/18 1233) Resp:  [18] 18 (06/18 1233) BP: (141)/(87) 141/87 mmHg (06/18 1233) SpO2:  [95 %] 95 % (06/18 1233)  Intake/Output from previous day:   Intake/Output this shift: Total I/O In: 1500 [I.V.:1500] Out: -   Physical exam the patient is somnolent but easily arousable. She is moving her lower extremities well.  Lab Results:  Recent Labs  09/25/12 1345  WBC 10.6*  HGB 12.6  HCT 38.5  PLT 295   BMET  Recent Labs  09/25/12 1345  NA 141  K 4.0  CL 102  CO2 25  GLUCOSE 124*  BUN 18  CREATININE 0.85  CALCIUM 10.1    Studies/Results: No results found.  Assessment/Plan: The patient is doing well. I will keep her flat for a couple days.  LOS: 0 days     Lexine Jaspers D 09/26/2012, 6:09 PM

## 2012-09-26 NOTE — Preoperative (Signed)
Beta Blockers   Reason not to administer Beta Blockers:Not Applicable 

## 2012-09-26 NOTE — Anesthesia Procedure Notes (Signed)
Procedure Name: Intubation Date/Time: 09/26/2012 4:15 PM Performed by: Ellin Goodie Pre-anesthesia Checklist: Patient identified, Emergency Drugs available, Suction available, Patient being monitored and Timeout performed Patient Re-evaluated:Patient Re-evaluated prior to inductionOxygen Delivery Method: Circle system utilized Preoxygenation: Pre-oxygenation with 100% oxygen Intubation Type: IV induction Ventilation: Mask ventilation without difficulty Laryngoscope Size: Mac and 4 Grade View: Grade I Tube type: Oral Tube size: 7.0 mm Number of attempts: 1 Airway Equipment and Method: Stylet Placement Confirmation: ETT inserted through vocal cords under direct vision,  positive ETCO2 and breath sounds checked- equal and bilateral Secured at: 23 cm Tube secured with: Tape Dental Injury: Teeth and Oropharynx as per pre-operative assessment  Comments: Easy atraumatic intubation with MAC 4 blade.  Placement verified by Dr. Gypsy Balsam .  Carlynn Herald, CRNA

## 2012-09-26 NOTE — Transfer of Care (Signed)
Immediate Anesthesia Transfer of Care Note  Patient: Shelby Newton  Procedure(s) Performed: Procedure(s) with comments: REPAIR OF CEREBROSPINAL FLUID LEAK (N/A) - repair of pseudomeningiocele    Patient Location: PACU  Anesthesia Type:General  Level of Consciousness: alert  and sedated  Airway & Oxygen Therapy: Patient connected to face mask oxygen  Post-op Assessment: Report given to PACU RN  Post vital signs: stable  Complications: No apparent anesthesia complications

## 2012-09-27 MED ORDER — ACETAMINOPHEN 325 MG PO TABS
650.0000 mg | ORAL_TABLET | Freq: Four times a day (QID) | ORAL | Status: DC | PRN
  Administered 2012-09-27 – 2012-09-28 (×4): 650 mg via ORAL
  Filled 2012-09-27 (×4): qty 2

## 2012-09-27 NOTE — Anesthesia Postprocedure Evaluation (Signed)
  Anesthesia Post-op Note  Patient: Shelby Newton  Procedure(s) Performed: Procedure(s) with comments: REPAIR OF CEREBROSPINAL FLUID LEAK (N/A) - repair of pseudomeningiocele    Patient Location: PACU  Anesthesia Type:General  Level of Consciousness: awake  Airway and Oxygen Therapy: Patient Spontanous Breathing  Post-op Pain: mild  Post-op Assessment: Post-op Vital signs reviewed, Patient's Cardiovascular Status Stable, Respiratory Function Stable, Patent Airway, No signs of Nausea or vomiting and Pain level controlled  Post-op Vital Signs: stable  Complications: No apparent anesthesia complications

## 2012-09-27 NOTE — Progress Notes (Signed)
Patient ID: Shelby Newton, female   DOB: 1945-08-31, 67 y.o.   MRN: [id number] Subjective:  The patient is alert and pleasant. She looks well. She has a mild headache.  Objective: Vital signs in last 24 hours: Temp:  [96.8 F (36 C)-97.8 F (36.6 C)] 97.8 F (36.6 C) (06/19 0515) Pulse Rate:  [64-93] 93 (06/19 0515) Resp:  [11-18] 17 (06/19 0515) BP: (134-171)/(49-87) 154/67 mmHg (06/19 0515) SpO2:  [93 %-100 %] 95 % (06/19 0515) Weight:  [83 kg (182 lb 15.7 oz)] 83 kg (182 lb 15.7 oz) (06/18 1938)  Intake/Output from previous day: 06/18 0701 - 06/19 0700 In: 1500 [I.V.:1500] Out: 400 [Urine:400] Intake/Output this shift:    Physical exam patient is alert and oriented. She is moving her lower extremities well. Her dressing is clean and dry.  Lab Results:  Recent Labs  09/25/12 1345  WBC 10.6*  HGB 12.6  HCT 38.5  PLT 295   BMET  Recent Labs  09/25/12 1345  NA 141  K 4.0  CL 102  CO2 25  GLUCOSE 124*  BUN 18  CREATININE 0.85  CALCIUM 10.1    Studies/Results: No results found.  Assessment/Plan: Postop day 1: The patient is doing well. I will keep her flat until Friday.  LOS: 1 day     Trevell Pariseau D 09/27/2012, 7:54 AM

## 2012-09-27 NOTE — Progress Notes (Signed)
UR COMPLETED  

## 2012-09-28 NOTE — Progress Notes (Signed)
Patient ID: Shelby Newton, female   DOB: 06/16/1945, 66 y.o.   MRN: [id number] Subjective:  The patient is alert and pleasant. She looks well. She is pleased with her progress. Her headache has resolved.  Objective: Vital signs in last 24 hours: Temp:  [97.6 F (36.4 C)-98.5 F (36.9 C)] 97.6 F (36.4 C) (06/20 0500) Pulse Rate:  [59-82] 59 (06/20 0500) Resp:  [15-18] 18 (06/20 0500) BP: (100-140)/(39-77) 139/56 mmHg (06/20 0500) SpO2:  [95 %-99 %] 95 % (06/20 0500)  Intake/Output from previous day: 06/19 0701 - 06/20 0700 In: 264 [P.O.:264] Out: 1100 [Urine:1100] Intake/Output this shift:    Physical exam the patient is alert and oriented x3. Her strength is normal. Her dressing is clean and dry.  Lab Results:  Recent Labs  09/25/12 1345  WBC 10.6*  HGB 12.6  HCT 38.5  PLT 295   BMET  Recent Labs  09/25/12 1345  NA 141  K 4.0  CL 102  CO2 25  GLUCOSE 124*  BUN 18  CREATININE 0.85  CALCIUM 10.1    Studies/Results: No results found.  Assessment/Plan: Postop day #2: The patient is doing well. We will mobilize her today. She will likely go home tomorrow. I gave her her discharge instructions and have answered all her and her husband's questions.  LOS: 2 days     Baudelia Schroepfer D 09/28/2012, 7:52 AM

## 2012-09-29 MED ORDER — DIAZEPAM 5 MG PO TABS: 5.0000 mg | ORAL_TABLET | Freq: Four times a day (QID) | ORAL | Status: DC | PRN

## 2012-09-29 MED ORDER — HYDROMORPHONE HCL 4 MG PO TABS: 4.0000 mg | ORAL_TABLET | ORAL | Status: DC | PRN

## 2012-09-29 NOTE — Progress Notes (Signed)
Subjective: Patient reports she's doing well  no leg pain back pain well controlled on pills  Objective: Vital signs in last 24 hours: Temp:  [97.8 F (36.6 C)-99.7 F (37.6 C)] 98.2 F (36.8 C) (06/21 0602) Pulse Rate:  [8-86] 68 (06/21 0602) Resp:  [17-20] 18 (06/21 0602) BP: (118-142)/(45-58) 118/45 mmHg (06/21 0602) SpO2:  [94 %-99 %] 94 % (06/21 0602)  Intake/Output from previous day:   Intake/Output this shift:    strength 5 out of 5 wound clean and dry  Lab Results: No results found for this basename: WBC, HGB, HCT, PLT,  in the last 72 hours BMET No results found for this basename: NA, K, CL, CO2, GLUCOSE, BUN, CREATININE, CALCIUM,  in the last 72 hours  Studies/Results: No results found.  Assessment/Plan: Discharge home   LOS: 3 days     Shamell Hittle P 09/29/2012, 6:48 AM

## 2012-09-29 NOTE — Progress Notes (Signed)
Pt d/c home with husband. D/c instructions and medications reviewed with PT. PT states understanding. All Pt questions answered

## 2012-09-29 NOTE — Discharge Summary (Signed)
  Physician Discharge Summary  Patient ID: Shelby Newton MRN: [id number] DOB/AGE: July 30, 1945 67 y.o.  Admit date: 09/26/2012 Discharge date: 09/29/2012  Admission Diagnoses:degenerative disc disease lumbar spinal stenosisand postoperative chronic CSF leak  Discharge Diagnoses: same Active Problems:   * No active hospital problems. *   Discharged Condition: good  Hospital Course: patient [name] hospital underwent a open repair of inadvertent durotomy postoperatively patient did very well recovered in the floor on the floor she was angling well convalescing well voiding spontaneously tolerating regular diagnoses and the partial headaches of CSF leak the wound was clean and dry and patient was stable and be discharged home scheduled followup with Dr. Lovell Sheehan.  Consults: Significant Diagnostic Studies: Treatments:open repair of CSF leak Discharge Exam: Blood pressure 118/45, pulse 68, temperature 98.2 F (36.8 C), temperature source Oral, resp. rate 18, height 5\' 3"  (1.6 m), weight 83 kg (182 lb 15.7 oz), SpO2 94.00%. Strength out of 5 wound clean and dry  Disposition: home     Medication List    TAKE these medications       acetaminophen 500 MG tablet  Commonly known as:  TYLENOL  Take 1,000 mg by mouth every 6 (six) hours as needed for pain.     aspirin EC 81 MG tablet  Take 81 mg by mouth daily.     cholecalciferol 1000 UNITS tablet  Commonly known as:  VITAMIN D  Take 1,000 Units by mouth daily.     diazepam 5 MG tablet  Commonly known as:  VALIUM  Take 1 tablet (5 mg total) by mouth every 6 (six) hours as needed.     diazepam 5 MG tablet  Commonly known as:  VALIUM  Take 5 mg by mouth 2 (two) times daily.     HYDROmorphone 4 MG tablet  Commonly known as:  DILAUDID  Take 1 tablet (4 mg total) by mouth every 4 (four) hours as needed.     lovastatin 20 MG tablet  Commonly known as:  MEVACOR  Take 20 mg by mouth at bedtime.     OVER THE COUNTER MEDICATION   Take 1 tablet by mouth daily. Stool softerner     valsartan-hydrochlorothiazide 320-12.5 MG per tablet  Commonly known as:  DIOVAN-HCT  Take 1 tablet by mouth daily.           Follow-up Information   Follow up with Cristi Loron, MD.   Contact information:   [address] [place] [address] [phone]       Signed: Mariam Dollar 09/29/2012, 6:51 AM

## 2014-03-20 ENCOUNTER — Ambulatory Visit
Admission: RE | Admit: 2014-03-20 | Discharge: 2014-03-20 | Disposition: A | Discharge status: Home / Self Care | Payer: Medicare Other | Source: Ambulatory Visit | Attending: Nurse Practitioner | Admitting: Nurse Practitioner

## 2014-03-20 ENCOUNTER — Other Ambulatory Visit: Payer: Self-pay | Admitting: Nurse Practitioner

## 2014-03-20 DIAGNOSIS — M25562 Pain in left knee: Secondary | ICD-10-CM

## 2014-04-08 ENCOUNTER — Other Ambulatory Visit: Payer: Self-pay | Admitting: Internal Medicine

## 2014-04-08 DIAGNOSIS — R1013 Epigastric pain: Secondary | ICD-10-CM

## 2014-04-09 ENCOUNTER — Ambulatory Visit
Admission: RE | Admit: 2014-04-09 | Discharge: 2014-04-09 | Disposition: A | Discharge status: Home / Self Care | Payer: Medicare Other | Source: Ambulatory Visit | Attending: Internal Medicine | Admitting: Internal Medicine

## 2014-04-09 DIAGNOSIS — R1013 Epigastric pain: Secondary | ICD-10-CM

## 2014-04-24 ENCOUNTER — Encounter (HOSPITAL_COMMUNITY): Payer: Self-pay | Admitting: Neurosurgery

## 2015-04-29 ENCOUNTER — Other Ambulatory Visit: Payer: Self-pay | Admitting: Otolaryngology

## 2015-04-30 ENCOUNTER — Other Ambulatory Visit: Payer: Self-pay | Admitting: Otolaryngology

## 2015-04-30 DIAGNOSIS — J349 Unspecified disorder of nose and nasal sinuses: Secondary | ICD-10-CM

## 2015-04-30 DIAGNOSIS — R0981 Nasal congestion: Secondary | ICD-10-CM

## 2015-10-02 ENCOUNTER — Ambulatory Visit
Admission: RE | Admit: 2015-10-02 | Discharge: 2015-10-02 | Disposition: A | Discharge status: Home / Self Care | Payer: Medicare Other | Source: Ambulatory Visit | Attending: Internal Medicine | Admitting: Internal Medicine

## 2015-10-02 ENCOUNTER — Other Ambulatory Visit: Payer: Self-pay | Admitting: Internal Medicine

## 2015-10-02 DIAGNOSIS — R509 Fever, unspecified: Secondary | ICD-10-CM

## 2017-08-25 ENCOUNTER — Ambulatory Visit: Payer: Medicare Other | Admitting: Podiatry

## 2017-08-29 ENCOUNTER — Ambulatory Visit (INDEPENDENT_AMBULATORY_CARE_PROVIDER_SITE_OTHER): Payer: Medicare Other | Admitting: Podiatry

## 2017-08-29 ENCOUNTER — Ambulatory Visit (INDEPENDENT_AMBULATORY_CARE_PROVIDER_SITE_OTHER): Payer: Medicare Other

## 2017-08-29 ENCOUNTER — Encounter: Payer: Self-pay | Admitting: Podiatry

## 2017-08-29 DIAGNOSIS — M21619 Bunion of unspecified foot: Secondary | ICD-10-CM

## 2017-08-29 DIAGNOSIS — M7751 Other enthesopathy of right foot: Secondary | ICD-10-CM

## 2017-08-29 DIAGNOSIS — M2141 Flat foot [pes planus] (acquired), right foot: Secondary | ICD-10-CM | POA: Diagnosis not present

## 2017-08-29 DIAGNOSIS — M779 Enthesopathy, unspecified: Secondary | ICD-10-CM | POA: Diagnosis not present

## 2017-08-29 DIAGNOSIS — M2142 Flat foot [pes planus] (acquired), left foot: Secondary | ICD-10-CM

## 2017-08-29 NOTE — Progress Notes (Signed)
Subjective:    Patient ID: Shelby Newton, female    DOB: 15-Nov-1945, 72 y.o.   MRN: [id number]  HPI 72 year old female presents the office today for concerns of left foot pain she points to the top of her foot as well as the inside aspect of the foot which is the majority of symptoms.  She has had no recent injury or trauma she denies any redness that she has noticed however she has some mild swelling at times and she describes an aching sensation.  No recent injury.  She has no other concerns today.   Review of Systems  All other systems reviewed and are negative.  Past Medical History:  Diagnosis Date  . Arthritis    "ALITTLE IN HER BACK"  . GERD (gastroesophageal reflux disease)    TAKES ZANTAC PRIOR TO TOMATOES  . H/O seasonal allergies   . Hypertension    ON MEDS 10-12 YRS NOW    Past Surgical History:  Procedure Laterality Date  . ABDOMINAL HYSTERECTOMY    . APPENDECTOMY    . TRANSFORAMINAL LUMBAR INTERBODY FUSION (TLIF) WITH PEDICLE SCREW FIXATION 1 LEVEL N/A 08/20/2012   Procedure: TRANSFORAMINAL LUMBAR INTERBODY FUSION (TLIF) WITH PEDICLE SCREW FIXATION ONE LEVEL;  Surgeon: Cristi Loron, MD;  Location: [facility];  Service: Neurosurgery;  Laterality: N/A;  Posteior Lumbar Four-Five Transforaminal Interbody and Fusion with Interbody Prothesis, Posterolateral Arthrodesis, and Posterior Nonsegmental Instrumentation     Current Outpatient Medications:  Marland Kitchen  MAGNESIUM PO, Take by mouth., Disp: , Rfl:  .  naproxen sodium (ALEVE) 220 MG tablet, Take 220 mg by mouth., Disp: , Rfl:  .  acetaminophen (TYLENOL) 500 MG tablet, Take 1,000 mg by mouth every 6 (six) hours as needed for pain., Disp: , Rfl:  .  amLODipine (NORVASC) 5 MG tablet, , Disp: , Rfl:  .  aspirin EC 81 MG tablet, Take 81 mg by mouth daily., Disp: , Rfl:  .  cholecalciferol (VITAMIN D) 1000 UNITS tablet, Take 1,000 Units by mouth daily., Disp: , Rfl:  .  OVER THE COUNTER MEDICATION, Take 1 tablet by  mouth daily. Stool softerner, Disp: , Rfl:  .  valsartan-hydrochlorothiazide (DIOVAN-HCT) 320-12.5 MG per tablet, Take 1 tablet by mouth daily., Disp: , Rfl:   Allergies  Allergen Reactions  . Sulfa Antibiotics Anaphylaxis  . Macrodantin [Nitrofurantoin Macrocrystal] Hives, Itching and Swelling  . Vicodin [Hydrocodone-Acetaminophen] Rash  . Yellow Dyes (Non-Tartrazine) Itching and Rash    Certain types of yellow dye, have not found specific type        Objective:   Physical Exam  General: AAO x3, NAD  Dermatological: Skin is warm, dry and supple bilateral. Nails x 10 are well manicured; remaining integument appears unremarkable at this time. There are no open sores, no preulcerative lesions, no rash or signs of infection present.  Vascular: Dorsalis Pedis artery and Posterior Tibial artery pedal pulses are 2/4 bilateral with immedate capillary fill time. Pedal hair growth present. No varicosities and no lower extremity edema present bilateral. There is no pain with calf compression, swelling, warmth, erythema.   Neruologic: Grossly intact via light touch bilateral.Protective threshold with Semmes Wienstein monofilament intact to all pedal sites bilateral.   Musculoskeletal: HAV is present.  There is tenderness on the medial aspect of the left foot and this starts at the level of the first MTPJ going to the first metatarsal cuneiform joint.  There is no area pinpoint bony tenderness or pain to vibratory sensation bilaterally.  There is first ray hypomobility present..  There is not no significant edema, erythema, increase in warmth.  There is a decrease in medial arch upon weightbearing.  Achilles tendon, plantar fascia, ankle ligaments and tendons are intact.  There is no other areas of tenderness identified today.  Muscular strength 5/5 in all groups tested bilateral.  Gait: Unassisted, Nonantalgic.     Assessment & Plan:  72 y70ear old female with left foot pain, capsulitis; HAV:  Flatfoot  -Treatment options discussed including all alternatives, risks, and complications -Etiology of symptoms were discussed -X-rays were obtained and reviewed with the patient.  Significant HAV is present.  Mild arthritic changes present.  There is no evidence of acute fracture or stress fracture identified today. -I prescribed Voltaren gel to apply topically.  We did discussion regards to treatment options with her shoes and orthotics.  I do think that using a more orthotic will be helpful for her.  Have her follow-up with Raiford Noble.  Vivi Barrack DPM

## 2017-08-30 ENCOUNTER — Telehealth: Payer: Self-pay | Admitting: Podiatry

## 2017-08-30 NOTE — Telephone Encounter (Signed)
Called Shelby Newton to let her know orthotics not covered by medicare Shelby Newton had already scheduled an appt with Raiford Noble and is aware of cost.  Shelby Newton then said Dr Ardelle Anton was to call in a gel/cream to her pharmacy for inflammation. In note it said Voltaren. Shelby Newton uses [facility]. I told Shelby Newton I would send to you and would call when taken care of. Shelby Newton is aware Dr Ardelle Anton not in office today.

## 2017-08-31 ENCOUNTER — Other Ambulatory Visit: Payer: Self-pay | Admitting: Podiatry

## 2017-08-31 MED ORDER — DICLOFENAC SODIUM 1 % TD GEL: 2.0000 g | Freq: Four times a day (QID) | TRANSDERMAL | 2 refills | Status: DC

## 2017-09-06 ENCOUNTER — Ambulatory Visit: Payer: Medicare Other | Admitting: Orthotics

## 2017-09-06 DIAGNOSIS — M2142 Flat foot [pes planus] (acquired), left foot: Principal | ICD-10-CM

## 2017-09-06 DIAGNOSIS — M2141 Flat foot [pes planus] (acquired), right foot: Secondary | ICD-10-CM

## 2017-09-06 DIAGNOSIS — M21619 Bunion of unspecified foot: Secondary | ICD-10-CM

## 2017-09-06 NOTE — Progress Notes (Signed)
Due to financial considerations, patient decided NOT to proceed with f/o at this time:  She is not willing to go through the summer w/o sandals.  I advised her to come back toward end of summer; also advised her to go to [employer] and invest in [employer] or [employer].Marland Kitchen

## 2017-10-16 ENCOUNTER — Other Ambulatory Visit: Payer: Self-pay | Admitting: Internal Medicine

## 2017-10-16 ENCOUNTER — Ambulatory Visit
Admission: RE | Admit: 2017-10-16 | Discharge: 2017-10-16 | Disposition: A | Discharge status: Home / Self Care | Payer: Medicare Other | Source: Ambulatory Visit | Attending: Internal Medicine | Admitting: Internal Medicine

## 2017-10-16 DIAGNOSIS — R053 Chronic cough: Secondary | ICD-10-CM

## 2017-10-16 DIAGNOSIS — R05 Cough: Secondary | ICD-10-CM

## 2018-10-06 IMAGING — DX DG CHEST 2V
2 series · 2 of 2 positions shown · non-contrast
Comparison: Chest x-ray of October 02, 2015

CLINICAL DATA: Chronic cough, history of allergies, nonsmoker.

EXAM:
CHEST - 2 VIEW

[dg chest 2 view (1 of 2)]
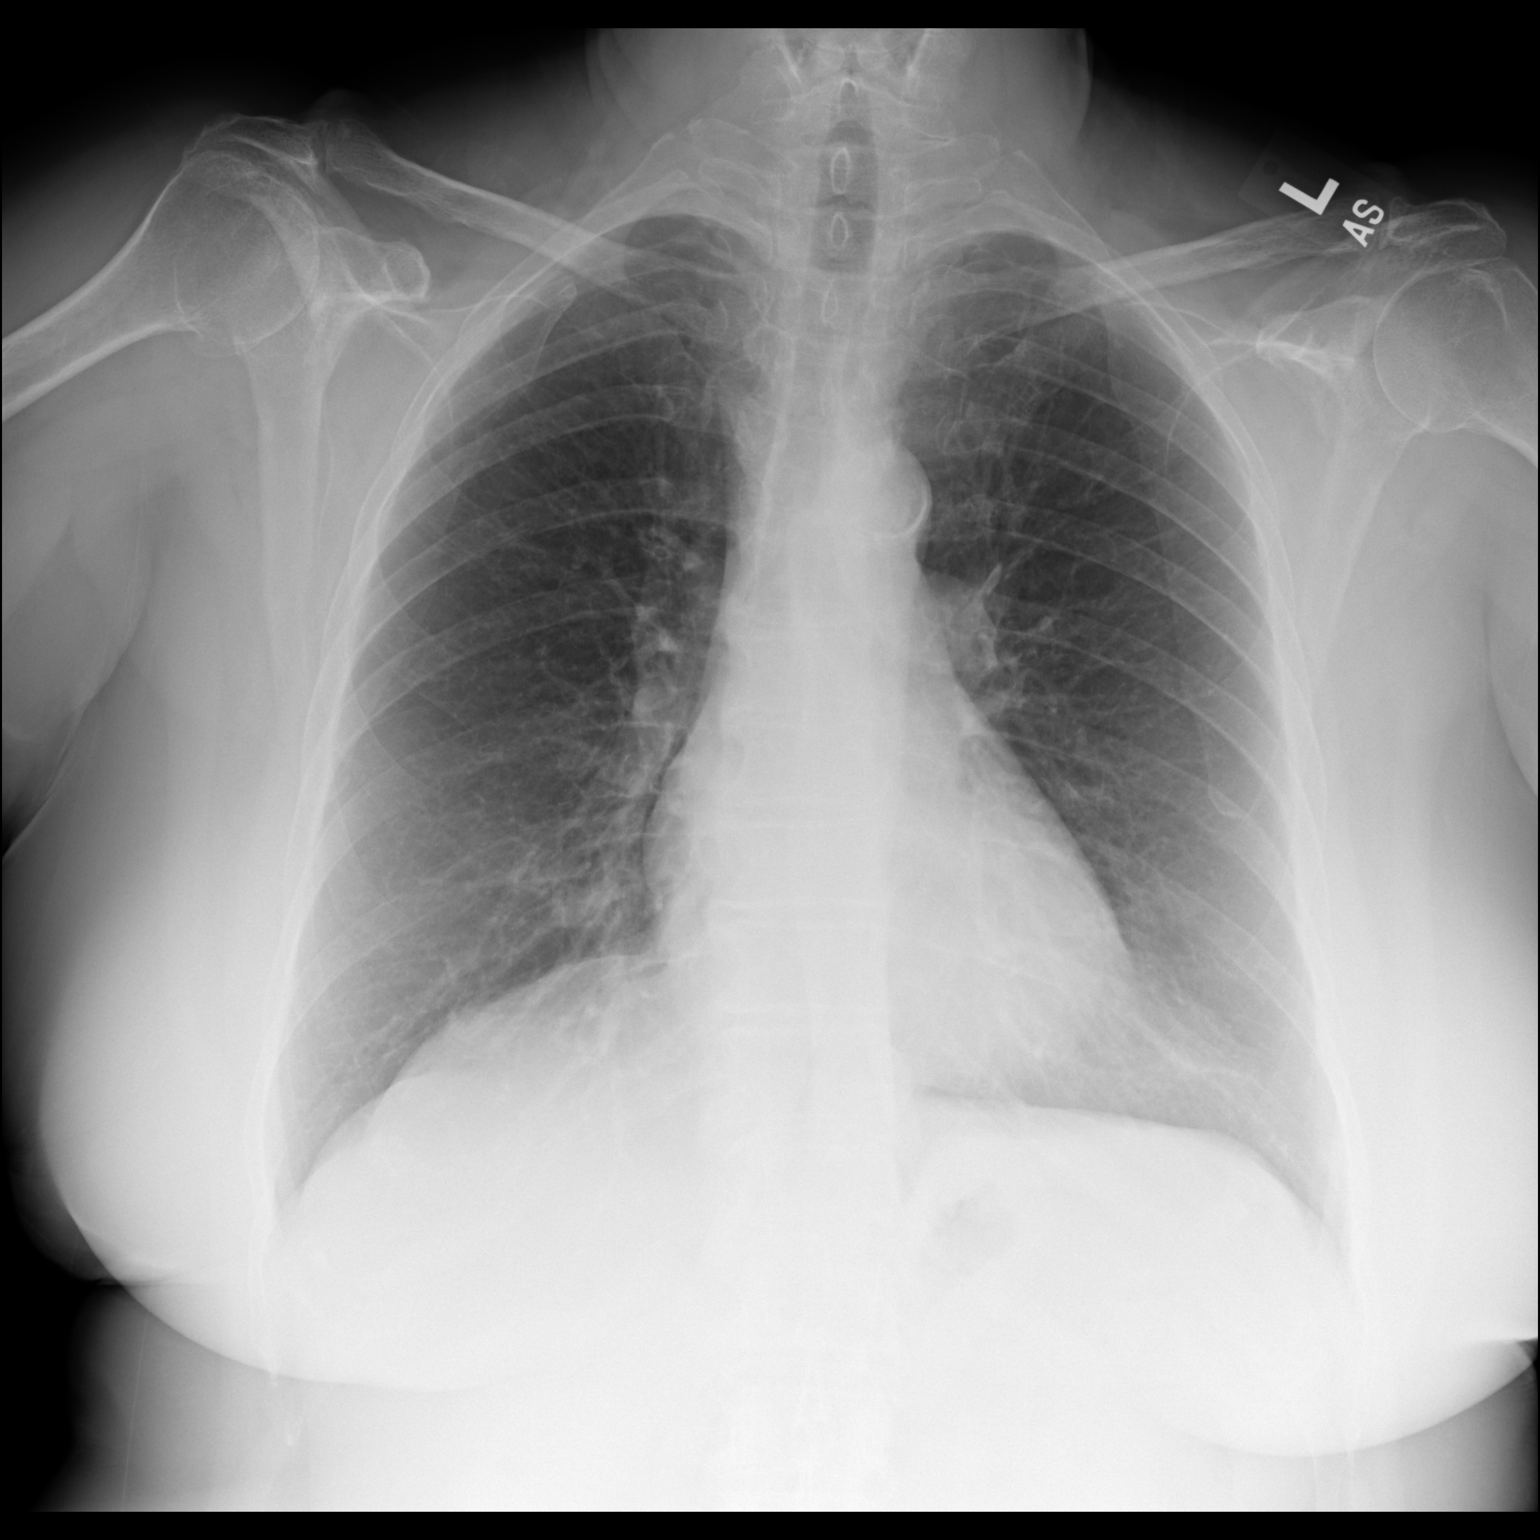

[dg chest 2 view (2 of 2)]
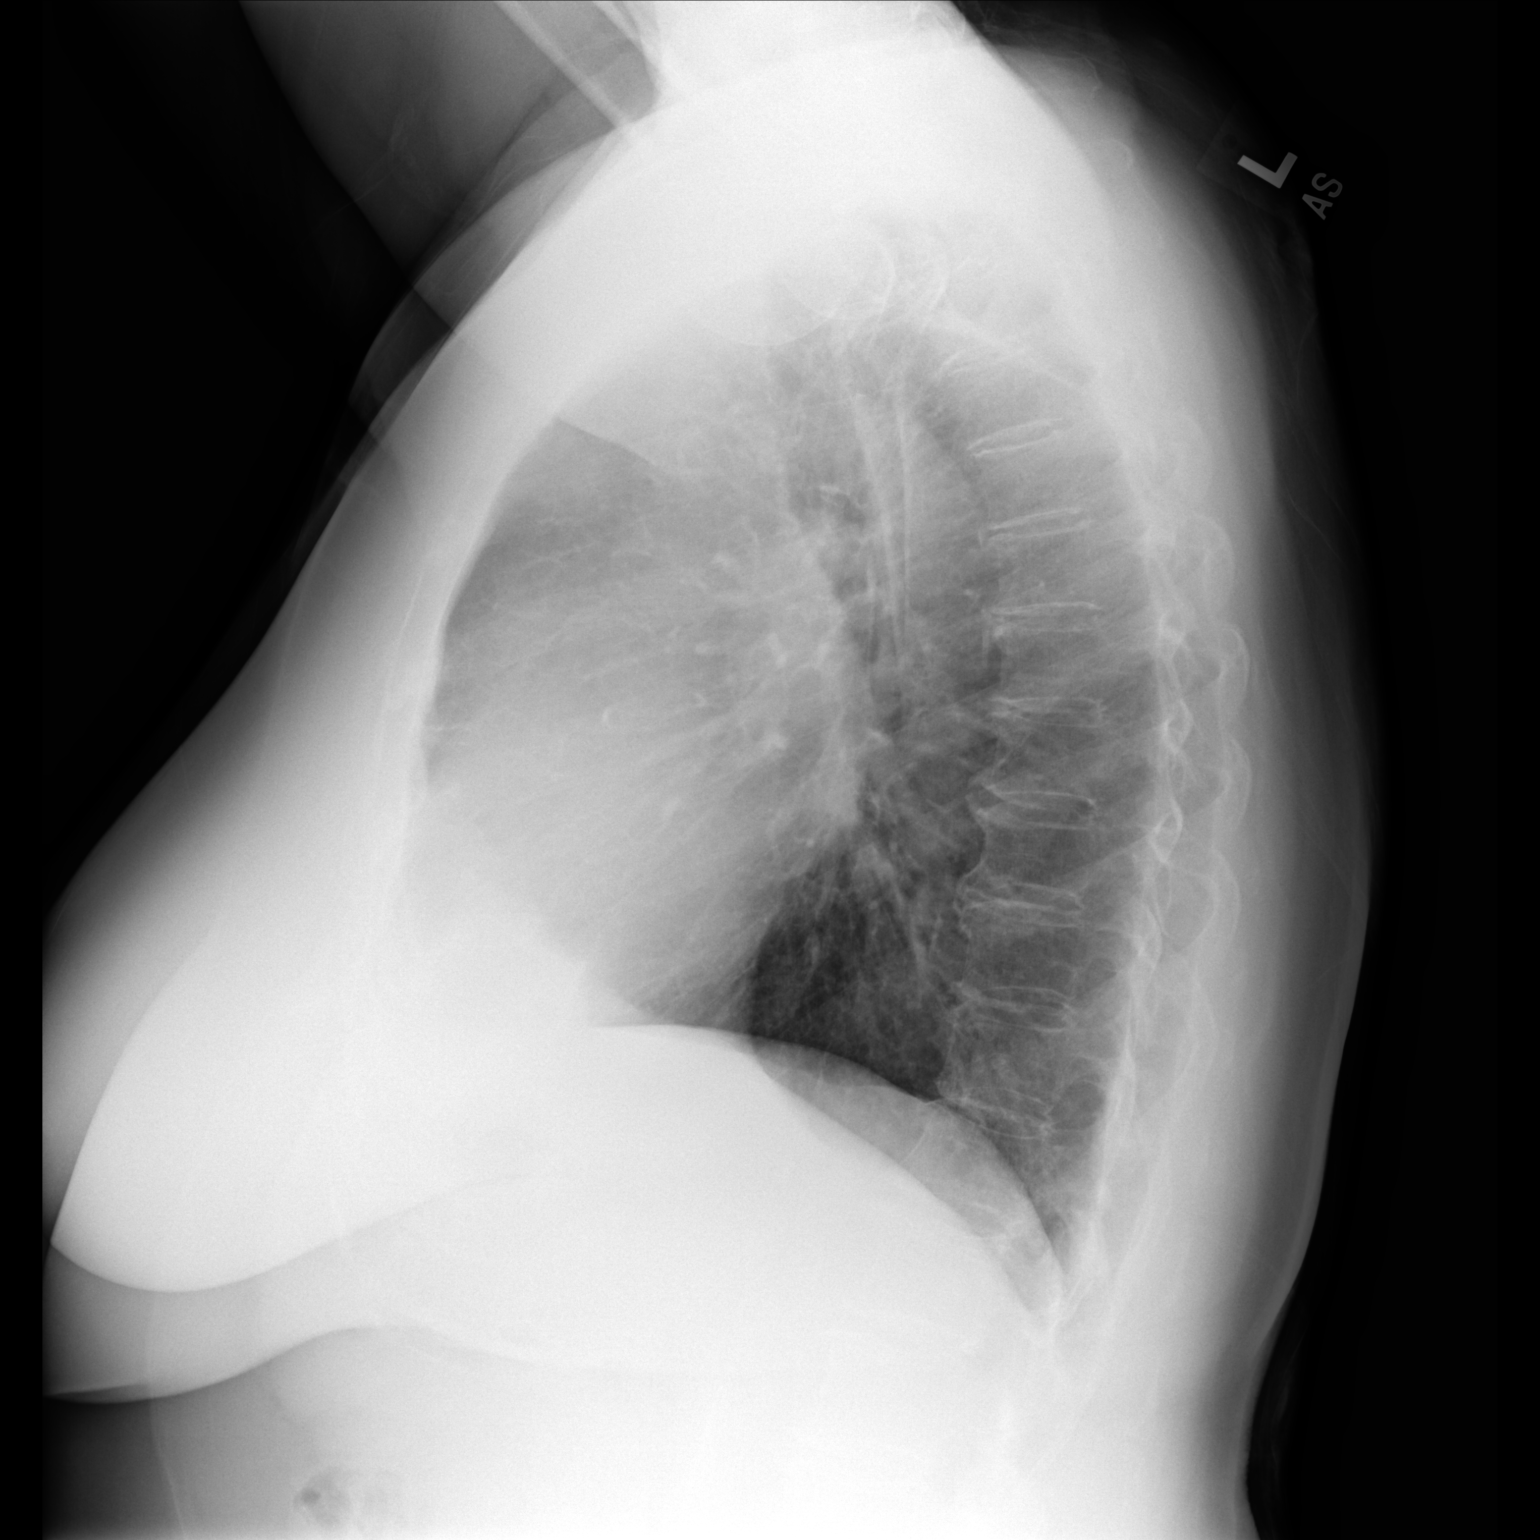

[2 of 2 positions shown; findings below may reference images not displayed]

FINDINGS: The lungs are mildly hyperinflated. The heart and pulmonary
vascularity are normal. The mediastinum is normal in width. There is
calcification in the wall of the aortic arch. The bony thorax
exhibits no acute abnormality.
IMPRESSION: [address]59 thick and a mild hyperinflation suggests reactive airway
disease or chronic bronchitic changes. No acute pneumonia nor CHF.

Thoracic aortic atherosclerosis.

## 2020-08-11 ENCOUNTER — Ambulatory Visit (INDEPENDENT_AMBULATORY_CARE_PROVIDER_SITE_OTHER): Payer: Medicare Other | Admitting: Podiatry

## 2020-08-11 ENCOUNTER — Other Ambulatory Visit: Payer: Self-pay

## 2020-08-11 ENCOUNTER — Ambulatory Visit (INDEPENDENT_AMBULATORY_CARE_PROVIDER_SITE_OTHER): Payer: Medicare Other

## 2020-08-11 DIAGNOSIS — M79671 Pain in right foot: Secondary | ICD-10-CM

## 2020-08-11 DIAGNOSIS — M722 Plantar fascial fibromatosis: Secondary | ICD-10-CM

## 2020-08-11 DIAGNOSIS — M79672 Pain in left foot: Secondary | ICD-10-CM | POA: Diagnosis not present

## 2020-08-11 MED ORDER — MELOXICAM 7.5 MG PO TABS: 7.5000 mg | ORAL_TABLET | Freq: Every day | ORAL | 0 refills | Status: DC

## 2020-08-11 NOTE — Patient Instructions (Signed)

## 2020-08-14 NOTE — Progress Notes (Signed)
Subjective: 75 year old female presents the office today for concerns of left foot pain worse than right which is been ongoing for about 6 months.  She states the pain is localized at the heel and more to the lateral aspect of the heel.  She discussed aching sensation.  No numbness or tingling she reports.  She said no recent treatment.  No injury.  She states it is worse in the morning if she sits down and stands back up.  Also gets pain when eating attributing her feet all day. Denies any systemic complaints such as fevers, chills, nausea, vomiting. No acute changes since last appointment, and no other complaints at this time.   Objective: AAO x3, NAD DP/PT pulses palpable bilaterally, CRT less than 3 seconds There is tenderness on the plantar central, plantar lateral aspect of the calcaneus at the insertion of plantar fascia bilaterally with the left side worse than right.  There is no pain with lateral compression of calcaneus.  No pain with Achilles tendon.  Minimal discomfort the arch of the foot.  No area of pinpoint tenderness.  No pain on the peroneal tendons, flexor tendons.  MMT 5/5. No pain with calf compression, swelling, warmth, erythema  Assessment: Bilateral plan fasciitis  Plan: -All treatment options discussed with the patient including all alternatives, risks, complications.  -X-rays were obtained and reviewed today.  No evidence of acute fracture or stress fracture. -Prescribed mobic. Discussed side effects of the medication and directed to stop if any are to occur and call the office.  -Held off on steroid injection today. -Plantar fascial brace is dispensed x2 -Encourage stretching, icing daily.  Discussed shoe modifications and orthotics. -Patient encouraged to call the office with any questions, concerns, change in symptoms.   Return in about 6 weeks (around 09/22/2020), or if symptoms worsen or fail to improve, for heel pain.  Vivi Barrack DPM

## 2020-09-22 ENCOUNTER — Ambulatory Visit (INDEPENDENT_AMBULATORY_CARE_PROVIDER_SITE_OTHER): Payer: Medicare Other | Admitting: Podiatry

## 2020-09-22 ENCOUNTER — Ambulatory Visit (INDEPENDENT_AMBULATORY_CARE_PROVIDER_SITE_OTHER): Payer: Medicare Other

## 2020-09-22 ENCOUNTER — Other Ambulatory Visit: Payer: Self-pay

## 2020-09-22 DIAGNOSIS — M76821 Posterior tibial tendinitis, right leg: Secondary | ICD-10-CM

## 2020-09-22 DIAGNOSIS — M7751 Other enthesopathy of right foot: Secondary | ICD-10-CM

## 2020-09-22 DIAGNOSIS — M722 Plantar fascial fibromatosis: Secondary | ICD-10-CM | POA: Diagnosis not present

## 2020-09-22 DIAGNOSIS — M7750 Other enthesopathy of unspecified foot: Secondary | ICD-10-CM

## 2020-09-22 NOTE — Patient Instructions (Signed)
For instructions on how to put on your Tri-Lock Ankle Brace, please visit [email or web address] 

## 2020-09-23 NOTE — Progress Notes (Signed)
Subjective: 75 year old female presents the office today for concerns of right ankle discomfort which started when she was on a cruise.  Denies any recent injury or trauma the onset of symptoms.  She states that the plantar fasciitis on the left foot is been doing well not having any significant pain to that area in the plantar fascial brace has been helpful.  She has noticed some swelling on the medial aspect of the right ankle where she points to the area of pain. Denies any systemic complaints such as fevers, chills, nausea, vomiting. No acute changes since last appointment, and no other complaints at this time.   Objective: AAO x3, NAD DP/PT pulses palpable bilaterally, CRT less than 3 seconds On the left side there is no significant discomfort.  On the right side there is tenderness to palpation with localized edema on the course of the posterior tibial, flexor tendons posterior and inferior to the medial malleolus.  She is unable to do a a full single heel rise on the right side given the discomfort.  No area of pinpoint tenderness.  Ankle, subtalar joint range of motion intact.  Decreased medial arch upon weightbearing.  No pain with calf compression, swelling, warmth, erythema  Assessment: Right posterior tibial tendon dysfunction, tendinitis; left plantar fasciitis  Plan: -All treatment options discussed with the patient including all alternatives, risks, complications.  -X-rays obtained and reviewed of the right side.  No subacute fracture identified. -Regards to the right side I dispensed a Tri-Lock ankle brace.  Anti-inflammatories.  Ice daily.  As she starts to feel better she can start some gentle stretching exercises.  If she has no improvement we will get an MRI of this to rule out a partial tear of the posterior tibial tendon.  I do think this could be due to overuse or compensation given her left foot pain.  She also has recently changed shoes which may have been aggravating for  this. -Left foot plantar fasciitis is doing much better.  Continue stretching, icing daily. -Patient encouraged to call the office with any questions, concerns, change in symptoms.   Shelby Newton DPM

## 2020-09-24 ENCOUNTER — Other Ambulatory Visit: Payer: Self-pay | Admitting: Podiatry

## 2020-09-24 ENCOUNTER — Telehealth: Payer: Self-pay | Admitting: *Deleted

## 2020-09-24 MED ORDER — MELOXICAM 7.5 MG PO TABS: 7.5000 mg | ORAL_TABLET | Freq: Every day | ORAL | 0 refills | Status: DC

## 2020-09-24 NOTE — Telephone Encounter (Signed)
Patient is calling to request a medication refill for Mobic,has changed her mind and would like it sent to pharmacy on file.Please advise.

## 2020-09-25 NOTE — Telephone Encounter (Signed)
Returned call to patient, no answer, left vmessage that medication has been sent. 

## 2020-11-03 ENCOUNTER — Other Ambulatory Visit: Payer: Self-pay

## 2020-11-03 ENCOUNTER — Ambulatory Visit (INDEPENDENT_AMBULATORY_CARE_PROVIDER_SITE_OTHER): Payer: Medicare Other | Admitting: Podiatry

## 2020-11-03 ENCOUNTER — Encounter: Payer: Self-pay | Admitting: Podiatry

## 2020-11-03 DIAGNOSIS — M7750 Other enthesopathy of unspecified foot: Secondary | ICD-10-CM | POA: Diagnosis not present

## 2020-11-03 DIAGNOSIS — M722 Plantar fascial fibromatosis: Secondary | ICD-10-CM

## 2020-11-06 NOTE — Progress Notes (Signed)
Subjective: 75 year old female presents the office today for follow-up evaluation of right ankle discomfort, posterior tibial tendon dysfunction as well as left plantar fasciitis.  She said the left side is starting to flare back up and thinks it is from compensation given the right ankle discomfort.  She gets swelling to the right ankle mostly.  No recent injury or trauma any changes in the last saw her. Denies any systemic complaints such as fevers, chills, nausea, vomiting. No acute changes since last appointment, and no other complaints at this time.   Objective: AAO x3, NAD DP/PT pulses palpable bilaterally, CRT less than 3 seconds There is still tenderness palpation on the right side along the course the posterior tibial tendon inferior to medial malleolus and proximal navicular tuberosity.  There is localized edema there is no erythema or warmth.  Clinically the tendon appears to be intact with adequate strength but having tenderness.  There is tenderness on the plantar aspect calcaneus and insertion of plantar fascia on the left side.  No pain along the posterior calcaneus.  No edema, erythema on the left side.  No area pinpoint tenderness.  MMT 5/5. No open lesions or pre-ulcerative lesions.  No pain with calf compression, swelling, warmth, erythema  Assessment: Right posterior tibial tendon dysfunction, left plantar fasciitis  Plan: -All treatment options discussed with the patient including all alternatives, risks, complications.  -Given the continuation of discomfort recommended MRI of the right ankle.  However for now wants to try other treatments.  We will start physical therapy referral was placed.  Discussed wearing shoes with good arch supports.  Ankle brace on the right side.  In regards to the left side I do think this is coming from compensation.  Plantar fascial brace dispensed.  Discussed stretching, icing daily.  Offered steroid injection if needed. -Meloxicam as  needed -Patient encouraged to call the office with any questions, concerns, change in symptoms.   Vivi Barrack DPM

## 2020-11-24 ENCOUNTER — Encounter: Payer: Self-pay | Admitting: Physical Therapy

## 2020-11-24 ENCOUNTER — Ambulatory Visit: Payer: Medicare Other | Attending: Podiatry | Admitting: Physical Therapy

## 2020-11-24 ENCOUNTER — Other Ambulatory Visit: Payer: Self-pay

## 2020-11-24 DIAGNOSIS — M79672 Pain in left foot: Secondary | ICD-10-CM | POA: Insufficient documentation

## 2020-11-24 DIAGNOSIS — M25571 Pain in right ankle and joints of right foot: Secondary | ICD-10-CM | POA: Diagnosis present

## 2020-11-24 DIAGNOSIS — R262 Difficulty in walking, not elsewhere classified: Secondary | ICD-10-CM | POA: Insufficient documentation

## 2020-11-24 DIAGNOSIS — R600 Localized edema: Secondary | ICD-10-CM | POA: Insufficient documentation

## 2020-11-24 DIAGNOSIS — R2689 Other abnormalities of gait and mobility: Secondary | ICD-10-CM | POA: Insufficient documentation

## 2020-11-24 DIAGNOSIS — M6281 Muscle weakness (generalized): Secondary | ICD-10-CM | POA: Insufficient documentation

## 2020-11-24 NOTE — Therapy (Signed)
[facility] [address]  [address] [place], [place], [address] Phone: [phone]   Fax:  [phone]  Physical Therapy Evaluation  Patient Details  Name: SURAH PELLEY MRN: [id number] Date of Birth: [birth year] Referring Provider (PT): Vivi Barrack, DPM   Encounter Date: 11/24/2020   PT End of Session - 11/24/20 1052     Visit Number 1    Number of Visits 16    Date for PT Re-Evaluation 01/19/21    Authorization Type Medicare    PT Start Time 1052    PT Stop Time 1212    PT Time Calculation (min) 80 min    Activity Tolerance Patient tolerated treatment well    Behavior During Therapy WFL for tasks assessed/performed             Past Medical History:  Diagnosis Date   Arthritis    "ALITTLE IN HER BACK"   GERD (gastroesophageal reflux disease)    TAKES ZANTAC PRIOR TO TOMATOES   H/O seasonal allergies    Hypertension    ON MEDS 10-12 YRS NOW    Past Surgical History:  Procedure Laterality Date   ABDOMINAL HYSTERECTOMY     APPENDECTOMY     TRANSFORAMINAL LUMBAR INTERBODY FUSION (TLIF) WITH PEDICLE SCREW FIXATION 1 LEVEL N/A 08/20/2012   Procedure: TRANSFORAMINAL LUMBAR INTERBODY FUSION (TLIF) WITH PEDICLE SCREW FIXATION ONE LEVEL;  Surgeon: Cristi Loron, MD;  Location: [facility];  Service: Neurosurgery;  Laterality: N/A;  Posteior Lumbar Four-Five Transforaminal Interbody and Fusion with Interbody Prothesis, Posterolateral Arthrodesis, and Posterior Nonsegmental Instrumentation    There were no vitals filed for this visit.    Subjective Assessment - 11/24/20 1057     Subjective Pt reports L plantar fasciitis since March 2022 with onset of R ankle tendonitis as of May 2022. Has tried multiple recommended types of footwear with no change in pain.    Limitations Standing;Walking    How long can you stand comfortably? <10 minutes    How long can you walk comfortably? very minimal    Patient  Stated Goals "to be able to stand and walk w/o pain"    Currently in Pain? Yes    Pain Score 0-No pain   at rest; up to 8/10 at heel in weight bearing   Pain Location Heel    Pain Orientation Left;Lower    Pain Descriptors / Indicators Sharp    Pain Type Chronic pain    Pain Onset More than a month ago   March   Pain Frequency Intermittent   with any weight bearing/walking   Aggravating Factors  any weight bearing or walking    Pain Relieving Factors gently rubbing her foot, ice & elevation    Effect of Pain on Daily Activities limited tolerance for household chores and daily routine, unable to walk for exercise, unable to go shopping, unable to walk in bare feet    Multiple Pain Sites Yes    Pain Score 5   at rest, up to 10/10 when up on her feet/walking   Pain Location Foot    Pain Orientation Right;Lower;Medial;Lateral;Upper    Pain Descriptors / Indicators Aching    Pain Type Acute pain    Pain Onset More than a month ago   May 2022   Pain Frequency Constant    Aggravating Factors  as above    Pain Relieving Factors as above    Effect of Pain on Daily Activities  as above; foot fells like it could give way                [facility] PT Assessment - 11/24/20 1052       Assessment   Medical Diagnosis R ankle tendonitis & L plantar fasciitis    Referring Provider (PT) Vivi Barrack, DPM    Onset Date/Surgical Date --   L - March 2022; R - May 2022   Hand Dominance Right    Next MD Visit 12/15/20    Prior Therapy h/o PT for L shoulder      Precautions   Precautions None      Restrictions   Weight Bearing Restrictions No      Balance Screen   Has the patient fallen in the past 6 months No    Has the patient had a decrease in activity level because of a fear of falling?  Yes    Is the patient reluctant to leave their home because of a fear of falling?  No      Home Environment   Living Environment Private residence    Living Arrangements Spouse/significant other     Type of Home House    Home Access Stairs to enter    Entrance Stairs-Number of Steps 2    Entrance Stairs-Rails None    Home Layout Two level;Bed/bath upstairs    Alternate Level Stairs-Number of Steps 14    Alternate Level Stairs-Rails Right    Home Equipment None      Prior Function   Level of Independence Independent    Vocation Retired    Leisure active in [employer] - knitting & petal-pushers; would like to walk for exercise but had not done this in a few years      Cognition   Overall Cognitive Status Within Functional Limits for tasks assessed      Observation/Other Assessments   Focus on Therapeutic Outcomes (FOTO)  Foot = 40; predicted D/C FS = 58      ROM / Strength   AROM / PROM / Strength AROM;Strength      AROM   AROM Assessment Site Ankle    Right/Left Ankle Right;Left    Right Ankle Dorsiflexion 12    Right Ankle Plantar Flexion 40    Right Ankle Inversion 30    Right Ankle Eversion 22    Left Ankle Dorsiflexion 16    Left Ankle Plantar Flexion 40    Left Ankle Inversion 26    Left Ankle Eversion 28      Strength   Strength Assessment Site Hip;Knee;Ankle    Right/Left Hip Right;Left    Right Hip Flexion 3+/5    Right Hip Extension 3+/5    Right Hip External Rotation  3+/5    Right Hip Internal Rotation 4/5    Right Hip ABduction 3+/5    Right Hip ADduction 4-/5    Left Hip Flexion 3+/5    Left Hip Extension 3+/5    Left Hip External Rotation 3+/5    Left Hip Internal Rotation 4/5    Left Hip ABduction 3+/5    Left Hip ADduction 4-/5    Right/Left Knee Right;Left    Right Knee Flexion 4/5    Right Knee Extension 4/5    Left Knee Flexion 4/5    Left Knee Extension 4/5    Right/Left Ankle Right;Left    Right Ankle Dorsiflexion 4/5    Right Ankle Plantar Flexion 4-/5   NWB with  manual resistance   Right Ankle Inversion 4-/5    Right Ankle Eversion 4/5    Left Ankle Dorsiflexion 4-/5    Left Ankle Plantar Flexion 4/5    Left Ankle Inversion  4/5   NWB with manual resistance   Left Ankle Eversion 4-/5      Flexibility   Soft Tissue Assessment /Muscle Length yes    Hamstrings mild tight B    Quadriceps mod severe tight quads & hip flexors R; mod tight R    ITB severe tight R; mod tight L    Piriformis WFL    Quadratus Lumborum mod tight R      Ambulation/Gait   Assistive device None    Gait Pattern Antalgic;Decreased weight shift to right;Decreased stance time - right;Decreased step length - right;Decreased step length - left    Ambulation Surface Level;Indoor                        Objective measurements completed on examination: See above findings.               PT Education - 11/24/20 1210     Education Details PT eval findings, relevant anatomy, anticipated POC & initial HEP including plantar fascia ice massage - Access Code: [email or web address]    Person(s) Educated Patient    Methods Explanation;Demonstration;Verbal cues;Tactile cues;Handout    Comprehension Verbalized understanding;Verbal cues required;Tactile cues required;Returned demonstration;Need further instruction              PT Short Term Goals - 11/24/20 1212       PT SHORT TERM GOAL #1   Title Patient will be independent with initial HEP    Status New               PT Long Term Goals - 11/24/20 1212       PT LONG TERM GOAL #1   Title Patient will be independent with ongoing/advanced HEP for self-management at home in order to build upon functional gains in therapy    Status New    Target Date 01/19/21      PT LONG TERM GOAL #2   Title Decrease B foot/ankle pain by >/= 50-75% allowing patient improved standing and walking tolerance    Status New    Target Date 01/19/21      PT LONG TERM GOAL #3   Title Patient to improve B ankle AROM to [facility] without pain provocation    Status New    Target Date 01/19/21      PT LONG TERM GOAL #4   Title Patient will demonstrate improved B LE strength to >/= 4 to 4+/5 for  improved stability and ease of mobility    Status New    Target Date 01/19/21      PT LONG TERM GOAL #5   Title Patient will improve standing and/or walking tolerance to >/= 30 minutes w/o pain interference to allow resumption of normal daily activities    Status New    Target Date 01/19/21                    Plan - 11/24/20 1212     Clinical Impression Statement Wynona Canes is a 75 y/o female who presents to OP PT for B foot and ankle pain due to L plantar fasciitis originating in March and L posterior tibial tendonitis originating in May of this year w/o identifiable triggers, although R foot/ankle pain likely due to compensation  with mobility/gait for L foot pain. 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 B foot and ankle pain limit standing and walking tolerance for all household activities and limits her ability to go out. Current deficits include near constant R foot/ankle pain and intermittent L inferior heel and plantar fascia pain (both worst with standing and walking), mild L foot edema, increased muscle tension and crepitus evident in L plantar fascia/quadratus plantae, TTP along R posterior tibialis tendon, limited B ankle AROM, limited flexibility in B gastroc/soleus and proximal LE musculature, along with limitations in B ankle and proximal LE strength. Trula OreChristina will benefit from skilled PT to promote increased flexibility and decreased muscle tension to restore normal ROM and reduce pain as well as increased strength and proprioceptive control for improved B ankle stability.    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, GERD, allergies    Examination-Activity Limitations Bathing;Caring for Others;Dressing;Locomotion Level;Squat;Stairs;Stand;Transfers    Examination-Participation  Restrictions Church;Cleaning;Community Activity;Driving;Laundry;Meal Prep;Shop;Volunteer;Yard Work    Conservation officer, historic buildingstability/Clinical Decision Making Evolving/Moderate complexity    Clinical Decision Making Moderate    Rehab Potential Good    PT Frequency 2x / week    PT Duration 8 weeks    PT Treatment/Interventions ADLs/Self Care Home Management;Cryotherapy;Electrical Stimulation;Iontophoresis 4mg /ml Dexamethasone;Moist Heat;Ultrasound;Gait training;Stair training;Functional mobility training;Therapeutic activities;Therapeutic exercise;Balance training;Neuromuscular re-education;Patient/family education;Manual techniques;Passive range of motion;Dry needling;Taping;Vasopneumatic Device;Joint Manipulations    PT Next Visit Plan Review initial HEP; progress ankle & proximal LE  flexibility as tolerated; initiate gentle foot intrinsic & ankle strengthening; core/proximal LE strengthening; manual therapy and modalities PRN    PT Home Exercise Plan Access Code: [email or web address] (8/16)    Consulted and Agree with Plan of Care Patient             Patient will benefit from skilled therapeutic intervention in order to improve the following deficits and impairments:  Abnormal gait, Decreased activity tolerance, Decreased balance, Decreased endurance, Decreased mobility, Decreased range of motion, Decreased strength, Difficulty walking, Increased edema, Increased fascial restricitons, Increased muscle spasms, Impaired perceived functional ability, Impaired flexibility, Improper body mechanics, Postural dysfunction, Pain  Visit Diagnosis: Pain in right ankle and joints of right foot  Pain in left foot  Difficulty in walking, not elsewhere classified  Other abnormalities of gait and mobility  Muscle weakness (generalized)  Localized edema     Problem List There are no problems to display for this patient.   Marry GuanJoAnne M Massey Ruhland, PT, MPT 11/24/2020, 12:56 PM  [facility] [address]  [address] [place], [place], [address] Phone: [phone]   Fax:  [phone]  Name: Primitivo GauzeChristina K Fischbach MRN: [id number] Date of Birth: 1945-09-09

## 2020-11-24 NOTE — Patient Instructions (Signed)
      Access Code: [email or web address] URL: [email or web address] Date: 11/24/2020 Prepared by: Glenetta Hew  Exercises Hooklying Hamstring Stretch with Strap - 2-3 x daily - 7 x weekly - 3 reps - 30 sec hold Supine ITB Stretch with Strap - 2-3 x daily - 7 x weekly - 3 reps - 30 sec hold Supine Quadriceps Stretch with Strap on Table - 2-3 x daily - 7 x weekly - 3 reps - 30 sec hold Seated Plantar Fascia Stretch - 2-3 x daily - 7 x weekly - 3 reps - 30 sec hold Long Sitting Plantar Fascia Stretch with Towel - 2-3 x daily - 7 x weekly - 3 reps - 30 sec hold Long Sitting Calf Stretch with Strap - 2-3 x daily - 7 x weekly - 3 reps - 30 sec hold Long Sitting Ankle PROM Inversion Eversion - 2-3 x daily - 7 x weekly - 2 sets - 10 reps - 5 sec hold Foot Roller Plantar Massage - 2-3 x daily - 7 x weekly - 2-3 min hold  Patient Education Ice Massage

## 2020-12-04 ENCOUNTER — Ambulatory Visit: Payer: Medicare Other

## 2020-12-04 ENCOUNTER — Other Ambulatory Visit: Payer: Self-pay

## 2020-12-04 DIAGNOSIS — M6281 Muscle weakness (generalized): Secondary | ICD-10-CM

## 2020-12-04 DIAGNOSIS — M25571 Pain in right ankle and joints of right foot: Secondary | ICD-10-CM

## 2020-12-04 DIAGNOSIS — M79672 Pain in left foot: Secondary | ICD-10-CM

## 2020-12-04 DIAGNOSIS — R2689 Other abnormalities of gait and mobility: Secondary | ICD-10-CM

## 2020-12-04 DIAGNOSIS — R600 Localized edema: Secondary | ICD-10-CM

## 2020-12-04 DIAGNOSIS — R262 Difficulty in walking, not elsewhere classified: Secondary | ICD-10-CM

## 2020-12-04 NOTE — Therapy (Signed)
[facility] Outpatient Rehabilitation [facility] [address]  [address] [place], [place], [address] Phone: [phone]   Fax:  [phone]  Physical Therapy Treatment  Patient Details  Name: Shelby Newton MRN: [id number] Date of Birth: [birth year] Referring Provider (PT): Vivi Barrack, DPM   Encounter Date: 12/04/2020   PT End of Session - 12/04/20 1149     Visit Number 2    Number of Visits 16    Date for PT Re-Evaluation 01/19/21    Authorization Type Medicare    PT Start Time 1102    PT Stop Time 1148    PT Time Calculation (min) 46 min    Activity Tolerance Patient tolerated treatment well    Behavior During Therapy WFL for tasks assessed/performed             Past Medical History:  Diagnosis Date   Arthritis    "ALITTLE IN HER BACK"   GERD (gastroesophageal reflux disease)    TAKES ZANTAC PRIOR TO TOMATOES   H/O seasonal allergies    Hypertension    ON MEDS 10-12 YRS NOW    Past Surgical History:  Procedure Laterality Date   ABDOMINAL HYSTERECTOMY     APPENDECTOMY     TRANSFORAMINAL LUMBAR INTERBODY FUSION (TLIF) WITH PEDICLE SCREW FIXATION 1 LEVEL N/A 08/20/2012   Procedure: TRANSFORAMINAL LUMBAR INTERBODY FUSION (TLIF) WITH PEDICLE SCREW FIXATION ONE LEVEL;  Surgeon: Cristi Loron, MD;  Location: [facility];  Service: Neurosurgery;  Laterality: N/A;  Posteior Lumbar Four-Five Transforaminal Interbody and Fusion with Interbody Prothesis, Posterolateral Arthrodesis, and Posterior Nonsegmental Instrumentation    There were no vitals filed for this visit.   Subjective Assessment - 12/04/20 1105     Subjective Having soreness today more than pain, feeling way better than last time she came in.    Patient Stated Goals "to be able to stand and walk w/o pain"    Currently in Pain? No/denies                               [facility] PT Treatment/Exercise - 12/04/20 0001       Exercises   Exercises  Ankle;Knee/Hip      Knee/Hip Exercises: Stretches   Passive Hamstring Stretch Both;30 seconds    Passive Hamstring Stretch Limitations supine with strap    Hip Flexor Stretch Both;30 seconds      Ankle Exercises: Stretches   Plantar Fascia Stretch 2 reps;30 seconds    Plantar Fascia Stretch Limitations seated    Gastroc Stretch 30 seconds    Gastroc Stretch Limitations long sit with strap      Ankle Exercises: Aerobic   Recumbent Bike L1x31min      Ankle Exercises: Seated   Towel Crunch Limitations 10 reps; bil    Heel Raises Both;10 reps    Toe Raise 10 reps                      PT Short Term Goals - 12/04/20 1209       PT SHORT TERM GOAL #1   Title Patient will be independent with initial HEP    Status On-going    Target Date 12/16/20               PT Long Term Goals - 12/04/20 1209       PT LONG TERM GOAL #1   Title Patient will  be independent with ongoing/advanced HEP for self-management at home in order to build upon functional gains in therapy    Status On-going      PT LONG TERM GOAL #2   Title Decrease B foot/ankle pain by >/= 50-75% allowing patient improved standing and walking tolerance    Status On-going      PT LONG TERM GOAL #3   Title Patient to improve B ankle AROM to [facility] without pain provocation    Status On-going      PT LONG TERM GOAL #4   Title Patient will demonstrate improved B LE strength to >/= 4 to 4+/5 for improved stability and ease of mobility    Status On-going      PT LONG TERM GOAL #5   Title Patient will improve standing and/or walking tolerance to >/= 30 minutes w/o pain interference to allow resumption of normal daily activities    Status On-going                   Plan - 12/04/20 1152     Clinical Impression Statement Pt reportedly has seen improvements with her B foot and ankle pain since her evaluation. She required cues with mostly gastroc and plantar fascia stretches. Also needed cues to  increase hip extension with hip flexor stretching. She may need further review with the hip stretches and foot stretches. I educated her to add heel and toe raises to her HEP, I offered pictures but she declined. Tightness observed with hip flexor stretching and cues to avoid pushin into pain. Reviewed with her possible reasons for increased foot pain, also suggested trying different shoes with good heel support to reduce pressure on her feet. Overall she had a good response and is improving thus far.    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, GERD, allergies    PT Frequency 2x / week    PT Duration 8 weeks    PT Treatment/Interventions ADLs/Self Care Home Management;Cryotherapy;Electrical Stimulation;Iontophoresis 4mg /ml Dexamethasone;Moist Heat;Ultrasound;Gait training;Stair training;Functional mobility training;Therapeutic activities;Therapeutic exercise;Balance training;Neuromuscular re-education;Patient/family education;Manual techniques;Passive range of motion;Dry needling;Taping;Vasopneumatic Device;Joint Manipulations    PT Next Visit Plan initiate gentle foot intrinsic & ankle strengthening; core/proximal LE strengthening; manual therapy and modalities PRN    PT Home Exercise Plan Access Code: (8/16)    Consulted and Agree with Plan of Care Patient             Patient will benefit from skilled therapeutic intervention in order to improve the following deficits and impairments:  Abnormal gait, Decreased activity tolerance, Decreased balance, Decreased endurance, Decreased mobility, Decreased range of motion, Decreased strength, Difficulty walking, Increased edema, Increased fascial restricitons, Increased muscle spasms, Impaired perceived functional ability, Impaired flexibility, Improper body mechanics, Postural dysfunction, Pain  Visit  Diagnosis: Pain in right ankle and joints of right foot  Pain in left foot  Difficulty in walking, not elsewhere classified  Other abnormalities of gait and mobility  Muscle weakness (generalized)  Localized edema     Problem List There are no problems to display for this patient.   01-10-1998, PTA 12/04/2020, 12:14 PM  [facility] [address]  [address] [place], [place], [place] Phone: [phone]   Fax:  [phone]  Name: Shelby Newton MRN: [name] Date of Birth: 1946/03/03

## 2020-12-11 ENCOUNTER — Ambulatory Visit: Payer: Medicare Other | Attending: Podiatry | Admitting: Physical Therapy

## 2020-12-11 ENCOUNTER — Encounter: Payer: Self-pay | Admitting: Physical Therapy

## 2020-12-11 ENCOUNTER — Other Ambulatory Visit: Payer: Self-pay

## 2020-12-11 DIAGNOSIS — R2689 Other abnormalities of gait and mobility: Secondary | ICD-10-CM | POA: Diagnosis present

## 2020-12-11 DIAGNOSIS — R262 Difficulty in walking, not elsewhere classified: Secondary | ICD-10-CM | POA: Insufficient documentation

## 2020-12-11 DIAGNOSIS — M25571 Pain in right ankle and joints of right foot: Secondary | ICD-10-CM | POA: Insufficient documentation

## 2020-12-11 DIAGNOSIS — M6281 Muscle weakness (generalized): Secondary | ICD-10-CM | POA: Diagnosis present

## 2020-12-11 DIAGNOSIS — R600 Localized edema: Secondary | ICD-10-CM | POA: Insufficient documentation

## 2020-12-11 DIAGNOSIS — M79672 Pain in left foot: Secondary | ICD-10-CM | POA: Diagnosis present

## 2020-12-11 NOTE — Therapy (Signed)
[facility] [place] [address]  [place], [place], [address] Phone: [phone]   Fax:  [phone]  Physical Therapy Treatment / Progress Note  Patient Details  Name: Shelby Newton MRN: [id number] Date of Birth: [birth year] Referring Provider (PT): Trula Slade, DPM  Progress Note  Reporting Period 11/24/2020 to 12/11/2020  See note below for Objective Data and Assessment of Progress/Goals.     Encounter Date: 12/11/2020   PT End of Session - 12/11/20 1103     Visit Number 3    Number of Visits 16    Date for PT Re-Evaluation 01/19/21    Authorization Type Medicare    Progress Note Due on Visit 19   MD PN on visit #3 - 12/11/20   PT Start Time 1103    PT Stop Time 1157    PT Time Calculation (min) 54 min    Activity Tolerance Patient tolerated treatment well    Behavior During Therapy WFL for tasks assessed/performed             Past Medical History:  Diagnosis Date   Arthritis    "ALITTLE IN HER BACK"   GERD (gastroesophageal reflux disease)    TAKES ZANTAC PRIOR TO TOMATOES   H/O seasonal allergies    Hypertension    ON MEDS 10-12 YRS NOW    Past Surgical History:  Procedure Laterality Date   ABDOMINAL HYSTERECTOMY     APPENDECTOMY     TRANSFORAMINAL LUMBAR INTERBODY FUSION (TLIF) WITH PEDICLE SCREW FIXATION 1 LEVEL N/A 08/20/2012   Procedure: TRANSFORAMINAL LUMBAR INTERBODY FUSION (TLIF) WITH PEDICLE SCREW FIXATION ONE LEVEL;  Surgeon: Ophelia Charter, MD;  Location: [facility];  Service: Neurosurgery;  Laterality: N/A;  Posteior Lumbar Four-Five Transforaminal Interbody and Fusion with Interbody Prothesis, Posterolateral Arthrodesis, and Posterior Nonsegmental Instrumentation    There were no vitals filed for this visit.   Subjective Assessment - 12/11/20 1105     Subjective Pt reports her pain is much better than when she first came to PT. Typically no pain early in the day but still  has been towards the end of the day esp if she has been more active. Pt also reports she has stopped walking barefoot and has puchased new [employer] which seems to be helping.    Patient Stated Goals "to be able to stand and walk w/o pain"    Currently in Pain? No/denies    Pain Score 0-No pain   up to 5/10 later in the day   Pain Location Heel    Pain Orientation Left;Lower    Pain Descriptors / Indicators Sharp    Pain Type Chronic pain    Pain Score 0   up to 5/10 later in the day   Pain Orientation Right;Lower;Medial    Pain Descriptors / Indicators Sharp;Aching    Pain Type Acute pain                [facility] PT Assessment - 12/11/20 1103       Assessment   Medical Diagnosis R ankle tendonitis & L plantar fasciitis    Referring Provider (PT) Trula Slade, DPM    Onset Date/Surgical Date --   L - March 2022; R - May 2022   Next MD Visit 12/15/20      AROM   Right Ankle Dorsiflexion 16    Right Ankle Plantar Flexion 51    Right Ankle Inversion 30  Right Ankle Eversion 26    Left Ankle Dorsiflexion 16    Left Ankle Plantar Flexion 52    Left Ankle Inversion 27    Left Ankle Eversion 26      Strength   Right Hip Flexion 3+/5    Right Hip Extension 4-/5   limited ROM   Right Hip External Rotation  4-/5    Right Hip Internal Rotation 4+/5    Right Hip ABduction 3+/5    Right Hip ADduction 4-/5    Left Hip Flexion 4-/5    Left Hip Extension 4-/5    Left Hip External Rotation 4-/5    Left Hip Internal Rotation 4+/5    Left Hip ABduction 4/5    Left Hip ADduction 4-/5    Right Knee Flexion 4+/5    Right Knee Extension 4+/5    Left Knee Flexion 4+/5    Left Knee Extension 4+/5    Right Ankle Dorsiflexion 4+/5    Right Ankle Plantar Flexion 4/5   10 SLS heel raises   Right Ankle Inversion 4/5   pain with resistance   Right Ankle Eversion 3+/5    Left Ankle Dorsiflexion 4+/5    Left Ankle Plantar Flexion 4/5   10 SLS heel raises   Left Ankle  Inversion 4+/5    Left Ankle Eversion 4+/5                           [facility] Adult PT Treatment/Exercise - 12/11/20 1103       Exercises   Exercises Ankle      Knee/Hip Exercises: Stretches   Hip Flexor Stretch Left;Right;2 reps;30 seconds    Hip Flexor Stretch Limitations mod thomas with strap - cues to avoid elevating knee above hip while pulling back on strap; seated lunge position over edge of chair      Knee/Hip Exercises: Supine   Bridges Both;10 reps;Strengthening    Bridges Limitations + red TB hip ABD isometric    Other Supine Knee/Hip Exercises TrA + red TB alt hip ABD/ER 10 x 3"      Knee/Hip Exercises: Sidelying   Hip ABduction Limitations verbal instruction clarifying slight extension focus with lift    Clams R/L red TB clam 10 x 3"      Ankle Exercises: Aerobic   Recumbent Bike L1 x 6 min                    PT Education - 12/11/20 1157     Education Details HEP update - proximal strengthening - Access Code: [email or web address]    Person(s) Educated Patient    Methods Explanation;Demonstration;Verbal cues;Handout    Comprehension Verbalized understanding;Verbal cues required;Returned demonstration;Need further instruction              PT Short Term Goals - 12/11/20 1112       PT SHORT TERM GOAL #1   Title Patient will be independent with initial HEP    Status Achieved   12/11/20              PT Long Term Goals - 12/11/20 1113       PT LONG TERM GOAL #1   Title Patient will be independent with ongoing/advanced HEP for self-management at home in order to build upon functional gains in therapy    Status On-going    Target Date 01/19/21      PT LONG TERM GOAL #2   Title   Decrease B foot/ankle pain by >/= 50-75% allowing patient improved standing and walking tolerance    Status Partially Met   12/11/20 - Pt reporting 30-35% reduciton in pain   Target Date 01/19/21      PT LONG TERM GOAL #3   Title Patient to improve B ankle AROM to  WFL without pain provocation    Status Partially Met   12/11/20 - B ankle ROM WFL but still with some pain at extremes of ROM   Target Date 01/19/21      PT LONG TERM GOAL #4   Title Patient will demonstrate improved B LE strength to >/= 4 to 4+/5 for improved stability and ease of mobility    Status Partially Met    Target Date 01/19/21      PT LONG TERM GOAL #5   Title Patient will improve standing and/or walking tolerance to >/= 30 minutes w/o pain interference to allow resumption of normal daily activities    Status On-going    Target Date 01/19/21                   Plan - 12/11/20 1157     Clinical Impression Statement Nasia reports 30-35% improvement in pain thus far with PT, noting she is typically w/o pain early in the day and able to walk and get things done, however pain typically returning later in the day. Pain intensity less severe when it does occur and more easily relieved with rest breaks. She reports good compliance and independence with initial HEP, but did require some clarification of hip flexor stretch today - STG now met. Her B ankle ROM has improved with B feet now WNL and essentially symmetrical. Her strength is improving but continued weakness still evident in feet/ankles as well as proximally at hips - HEP updated with proximal LE strengthening exercises today and will plan to progress ankle strengthening next visit. Overall, Laisa is demonstrating good progress toward her LTGs with PT and will continue to benefit from skilled PT to promote improved flexibility for decreased muscle tension/tightness as well as to increase LE strength for improved stability and activity tolerance.    Comorbidities HTN, OA, LBP s/p L4-5 fusion 2014, GERD, allergies    Rehab Potential Good    PT Frequency 2x / week    PT Duration 8 weeks    PT Treatment/Interventions ADLs/Self Care Home Management;Cryotherapy;Electrical Stimulation;Iontophoresis 4mg/ml Dexamethasone;Moist  Heat;Ultrasound;Gait training;Stair training;Functional mobility training;Therapeutic activities;Therapeutic exercise;Balance training;Neuromuscular re-education;Patient/family education;Manual techniques;Passive range of motion;Dry needling;Taping;Vasopneumatic Device;Joint Manipulations    PT Next Visit Plan porgress foot intrinsic & ankle strengthening adding TB resistance; core/proximal LE strengthening; manual therapy and modalities PRN    PT Home Exercise Plan Access Code: [email or web address] (8/16, updated 9/2)    Consulted and Agree with Plan of Care Patient             Patient will benefit from skilled therapeutic intervention in order to improve the following deficits and impairments:  Abnormal gait, Decreased activity tolerance, Decreased balance, Decreased endurance, Decreased mobility, Decreased range of motion, Decreased strength, Difficulty walking, Increased edema, Increased fascial restricitons, Increased muscle spasms, Impaired perceived functional ability, Impaired flexibility, Improper body mechanics, Postural dysfunction, Pain  Visit Diagnosis: Pain in right ankle and joints of right foot  Pain in left foot  Difficulty in walking, not elsewhere classified  Other abnormalities of gait and mobility  Muscle weakness (generalized)  Localized edema     Problem List There are no problems to display for   this patient.   JoAnne M Kreis, PT, MPT 12/11/2020, 12:47 PM  [facility] [address]  [address] [place], [place], [address] Phone: [phone]   Fax:  [phone]  Name: Cathryn K Rout MRN: [id number] Date of Birth: 05/28/1945    

## 2020-12-11 NOTE — Patient Instructions (Signed)
    Access Code: [email or web address] URL: [email or web address] Date: 12/11/2020 Prepared by: Glenetta Hew  Exercises Hooklying Hamstring Stretch with Strap - 2-3 x daily - 7 x weekly - 3 reps - 30 sec hold Supine ITB Stretch with Strap - 2-3 x daily - 7 x weekly - 3 reps - 30 sec hold Supine Quadriceps Stretch with Strap on Table - 2-3 x daily - 7 x weekly - 3 reps - 30 sec hold Seated Plantar Fascia Stretch - 2-3 x daily - 7 x weekly - 3 reps - 30 sec hold Long Sitting Plantar Fascia Stretch with Towel - 2-3 x daily - 7 x weekly - 3 reps - 30 sec hold Long Sitting Calf Stretch with Strap - 2-3 x daily - 7 x weekly - 3 reps - 30 sec hold Long Sitting Ankle PROM Inversion Eversion - 2-3 x daily - 7 x weekly - 2 sets - 10 reps - 5 sec hold Foot Roller Plantar Massage - 2-3 x daily - 7 x weekly - 2-3 min hold Seated Hip Flexor Stretch - 2-3 x daily - 7 x weekly - 3 reps - 30 sec hold Hooklying Isometric Clamshell - 1 x daily - 3 x weekly - 2 sets - 10 reps - 3 sec hold Supine Bridge with Resistance Band - 1 x daily - 3 x weekly - 2 sets - 10 reps - 5 sec hold Clam with Resistance - 1 x daily - 3 x weekly - 2 sets - 10 reps - 3-5 sec hold Sidelying Hip Abduction - 1 x daily - 7 x weekly - 2 sets - 10 reps - 3-5 sec hold  Patient Education Ice Massage

## 2020-12-15 ENCOUNTER — Ambulatory Visit (INDEPENDENT_AMBULATORY_CARE_PROVIDER_SITE_OTHER): Payer: Medicare Other | Admitting: Podiatry

## 2020-12-15 ENCOUNTER — Other Ambulatory Visit: Payer: Self-pay

## 2020-12-15 DIAGNOSIS — M7750 Other enthesopathy of unspecified foot: Secondary | ICD-10-CM

## 2020-12-15 DIAGNOSIS — M722 Plantar fascial fibromatosis: Secondary | ICD-10-CM

## 2020-12-15 DIAGNOSIS — M76821 Posterior tibial tendinitis, right leg: Secondary | ICD-10-CM | POA: Diagnosis not present

## 2020-12-16 ENCOUNTER — Encounter: Payer: Self-pay | Admitting: Physical Therapy

## 2020-12-16 ENCOUNTER — Ambulatory Visit: Payer: Medicare Other | Admitting: Physical Therapy

## 2020-12-16 DIAGNOSIS — M25571 Pain in right ankle and joints of right foot: Secondary | ICD-10-CM

## 2020-12-16 DIAGNOSIS — R262 Difficulty in walking, not elsewhere classified: Secondary | ICD-10-CM

## 2020-12-16 DIAGNOSIS — R600 Localized edema: Secondary | ICD-10-CM

## 2020-12-16 DIAGNOSIS — M6281 Muscle weakness (generalized): Secondary | ICD-10-CM

## 2020-12-16 DIAGNOSIS — R2689 Other abnormalities of gait and mobility: Secondary | ICD-10-CM

## 2020-12-16 DIAGNOSIS — M79672 Pain in left foot: Secondary | ICD-10-CM

## 2020-12-16 NOTE — Progress Notes (Signed)
Subjective: 75 year old female presents the office today for follow-up evaluation of right ankle discomfort, posterior tibial tendon dysfunction as well as left plantar fasciitis.  She does feel that she has been making improvement with physical therapy has been helpful.  She is no longer wearing the brace.  She is been icing.  Although she states that she is still improving she still has discomfort towards the end of the day but she is making progress.  No recent injury or fall or changes otherwise.   Objective: AAO x3, NAD DP/PT pulses palpable bilaterally, CRT less than 3 seconds There is still tenderness palpation on the right side along the course the posterior tibial tendon inferior to medial malleolus and proximal navicular tuberosity although does appear to be improved.  There is localized but minimal edema there is no erythema or warmth.  There is tenderness on the plantar aspect calcaneus and insertion of plantar fascia on the left side.  No pain along the posterior calcaneus.  No edema, erythema on the left side.  No area pinpoint tenderness.  MMT 5/5. No open lesions or pre-ulcerative lesions.  No pain with calf compression, swelling, warmth, erythema  Assessment: Right posterior tibial tendon dysfunction, left plantar fasciitis  Plan: -All treatment options discussed with the patient including all alternatives, risks, complications.  -Although she is some discomfort she feels that she is making gradual progress.  Continue physical therapy.  I offered steroid injection for the left plantar fascial today but decided to hold off on this.  Continue with shoes and good arch supports as well.  Meloxicam as needed. -Patient encouraged to call the office with any questions, concerns, change in symptoms.   Vivi Barrack DPM

## 2020-12-16 NOTE — Patient Instructions (Signed)
    Access Code: [email or web address] URL: [email or web address] Date: 12/16/2020 Prepared by: Glenetta Hew  Exercises Hooklying Hamstring Stretch with Strap - 2-3 x daily - 7 x weekly - 3 reps - 30 sec hold Supine ITB Stretch with Strap - 2-3 x daily - 7 x weekly - 3 reps - 30 sec hold Supine Quadriceps Stretch with Strap on Table - 2-3 x daily - 7 x weekly - 3 reps - 30 sec hold Seated Plantar Fascia Stretch - 2-3 x daily - 7 x weekly - 3 reps - 30 sec hold Long Sitting Plantar Fascia Stretch with Towel - 2-3 x daily - 7 x weekly - 3 reps - 30 sec hold Long Sitting Calf Stretch with Strap - 2-3 x daily - 7 x weekly - 3 reps - 30 sec hold Long Sitting Ankle PROM Inversion Eversion - 2-3 x daily - 7 x weekly - 2 sets - 10 reps - 5 sec hold Foot Roller Plantar Massage - 2-3 x daily - 7 x weekly - 2-3 min hold Seated Hip Flexor Stretch - 2-3 x daily - 7 x weekly - 3 reps - 30 sec hold Hooklying Isometric Clamshell - 1 x daily - 3 x weekly - 2 sets - 10 reps - 3 sec hold Supine Bridge with Resistance Band - 1 x daily - 3 x weekly - 2 sets - 10 reps - 5 sec hold Clam with Resistance - 1 x daily - 3 x weekly - 2 sets - 10 reps - 3-5 sec hold Sidelying Hip Abduction - 1 x daily - 3 x weekly - 2 sets - 10 reps - 3-5 sec hold Seated Ankle Plantar Flexion with Resistance Loop - 1 x daily - 5 x weekly - 2 sets - 10 reps - 3 sec hold Seated Ankle Dorsiflexion with Resistance - 1 x daily - 5 x weekly - 2 sets - 10 reps - 3 sec hold Seated Ankle Eversion with Resistance - 1 x daily - 5 x weekly - 2 sets - 10 reps - 3 sec hold Seated Ankle Inversion with Resistance - 1 x daily - 5 x weekly - 2 sets - 10 reps - 3 sec hold Toe Flexion with Resistance - 1 x daily - 5 x weekly - 2 sets - 10 reps - 3 sec hold  Patient Education Ice Massage

## 2020-12-16 NOTE — Therapy (Signed)
[facility] [address]  [place], [place], [address] Phone: [phone]   Fax:  [phone]  Physical Therapy Treatment  Patient Details  Name: Shelby Newton MRN: [id number] Date of Birth: [birth year] Referring Provider (PT): Trula Slade, DPM   Encounter Date: 12/16/2020   PT End of Session - 12/16/20 1108     Visit Number 4    Number of Visits 16    Date for PT Re-Evaluation 01/19/21    Authorization Type Medicare    Progress Note Due on Visit 37   MD PN on visit #3 - 12/11/20   PT Start Time 1108    PT Stop Time 1150    PT Time Calculation (min) 42 min    Activity Tolerance Patient tolerated treatment well    Behavior During Therapy WFL for tasks assessed/performed             Past Medical History:  Diagnosis Date   Arthritis    "ALITTLE IN HER BACK"   GERD (gastroesophageal reflux disease)    TAKES ZANTAC PRIOR TO TOMATOES   H/O seasonal allergies    Hypertension    ON MEDS 10-12 YRS NOW    Past Surgical History:  Procedure Laterality Date   ABDOMINAL HYSTERECTOMY     APPENDECTOMY     TRANSFORAMINAL LUMBAR INTERBODY FUSION (TLIF) WITH PEDICLE SCREW FIXATION 1 LEVEL N/A 08/20/2012   Procedure: TRANSFORAMINAL LUMBAR INTERBODY FUSION (TLIF) WITH PEDICLE SCREW FIXATION ONE LEVEL;  Surgeon: Ophelia Charter, MD;  Location: [facility];  Service: Neurosurgery;  Laterality: N/A;  Posteior Lumbar Four-Five Transforaminal Interbody and Fusion with Interbody Prothesis, Posterolateral Arthrodesis, and Posterior Nonsegmental Instrumentation    There were no vitals filed for this visit.   Subjective Assessment - 12/16/20 1111     Subjective Pt reports MD f/u went well - will see MD again in Nov if needed. Pt reports pain remains minimal early in the day but still feels that she needs a sit-down break by ~2:30 and again after preparing dinner.    Patient Stated Goals "to be able to stand and walk  w/o pain"                               [facility] Adult PT Treatment/Exercise - 12/16/20 1108       Exercises   Exercises Ankle      Ankle Exercises: Aerobic   Recumbent Bike L2 x 6 min      Ankle Exercises: Standing   Vector Stance Right;Left;5 reps    Vector Stance Limitations 3-way tap to cones; light UE support on back of chair    SLS 4 x 15 sec - light UE support on back of chair    Heel Raises Both;10 reps;3 seconds;Right;Left   2 sets   Heel Raises Limitations 2nd set with alt single leg eccentic lowering      Ankle Exercises: Seated   Marble Pickup R/L 2 x 10    Other Seated Ankle Exercises R/L red TB 4-way ankle x 10    Other Seated Ankle Exercises R/L red TB toe curls x 10                  Upper Extremity Functional Index Score :   /80   PT Education - 12/16/20 1308     Education Details HEP update - foot/ankle strengthening - Access Code:  [email or web address]    Person(s) Educated Patient    Methods Explanation;Demonstration;Verbal cues;Handout    Comprehension Verbalized understanding;Verbal cues required;Returned demonstration;Need further instruction              PT Short Term Goals - 12/11/20 1112       PT SHORT TERM GOAL #1   Title Patient will be independent with initial HEP    Status Achieved   12/11/20              PT Long Term Goals - 12/11/20 1113       PT LONG TERM GOAL #1   Title Patient will be independent with ongoing/advanced HEP for self-management at home in order to build upon functional gains in therapy    Status On-going    Target Date 01/19/21      PT LONG TERM GOAL #2   Title Decrease B foot/ankle pain by >/= 50-75% allowing patient improved standing and walking tolerance    Status Partially Met   12/11/20 - Pt reporting 30-35% reduciton in pain   Target Date 01/19/21      PT LONG TERM GOAL #3   Title Patient to improve B ankle AROM to [facility] without pain provocation    Status Partially Met   12/11/20 - B  ankle ROM WFL but still with some pain at extremes of ROM   Target Date 01/19/21      PT LONG TERM GOAL #4   Title Patient will demonstrate improved B LE strength to >/= 4 to 4+/5 for improved stability and ease of mobility    Status Partially Met    Target Date 01/19/21      PT LONG TERM GOAL #5   Title Patient will improve standing and/or walking tolerance to >/= 30 minutes w/o pain interference to allow resumption of normal daily activities    Status On-going    Target Date 01/19/21                   Plan - 12/16/20 1113     Clinical Impression Statement Shelby Newton reports MD f/u went well, and she will see him again in Nov PRN. She reports good tolerance for HEP updated last visit focusing on proximal strengthening and denies need for review. Progressed ankle and foot intrinsic strengthening with addition of red TB resisted exercises (HEP updated accordingly) as well as coordination with marble pickup. Introduced balance and Secretary/administrator with SLS and weight shifting activities - pt requiring light UE support. Shelby Newton is pleased with her progress thus, noting better tolerance for walking and no longer feels like she is limping.    Comorbidities HTN, OA, LBP s/p L4-5 fusion 2014, GERD, allergies    Rehab Potential Good    PT Frequency 2x / week    PT Duration 8 weeks    PT Treatment/Interventions ADLs/Self Care Home Management;Cryotherapy;Electrical Stimulation;Iontophoresis 4mg /ml Dexamethasone;Moist Heat;Ultrasound;Gait training;Stair training;Functional mobility training;Therapeutic activities;Therapeutic exercise;Balance training;Neuromuscular re-education;Patient/family education;Manual techniques;Passive range of motion;Dry needling;Taping;Vasopneumatic Device;Joint Manipulations    PT Next Visit Plan progress foot intrinsic & ankle strengthening; core/proximal LE strengthening; balance & proprioceptive training; manual therapy and modalities PRN    PT Home  Exercise Plan Access Code: [email or web address] (8/16, updated 9/2 & 9/7)    Consulted and Agree with Plan of Care Patient             Patient will benefit from skilled therapeutic intervention in order to improve the following deficits and impairments:  Abnormal gait, Decreased activity tolerance, Decreased  balance, Decreased endurance, Decreased mobility, Decreased range of motion, Decreased strength, Difficulty walking, Increased edema, Increased fascial restricitons, Increased muscle spasms, Impaired perceived functional ability, Impaired flexibility, Improper body mechanics, Postural dysfunction, Pain  Visit Diagnosis: Pain in right ankle and joints of right foot  Pain in left foot  Difficulty in walking, not elsewhere classified  Other abnormalities of gait and mobility  Muscle weakness (generalized)  Localized edema     Problem List There are no problems to display for this patient.   Percival Spanish, PT, MPT 12/16/2020, 1:09 PM  [facility] [address]  [place], [place], [address] Phone: [phone]   Fax:  [phone]  Name: Shelby [facility] MRN: [id number] Date of Birth: Mar 28, 1946

## 2020-12-23 ENCOUNTER — Ambulatory Visit: Payer: Medicare Other

## 2020-12-23 ENCOUNTER — Other Ambulatory Visit: Payer: Self-pay

## 2020-12-23 DIAGNOSIS — M25571 Pain in right ankle and joints of right foot: Secondary | ICD-10-CM | POA: Diagnosis not present

## 2020-12-23 DIAGNOSIS — M6281 Muscle weakness (generalized): Secondary | ICD-10-CM

## 2020-12-23 DIAGNOSIS — R2689 Other abnormalities of gait and mobility: Secondary | ICD-10-CM

## 2020-12-23 DIAGNOSIS — R600 Localized edema: Secondary | ICD-10-CM

## 2020-12-23 DIAGNOSIS — M79672 Pain in left foot: Secondary | ICD-10-CM

## 2020-12-23 DIAGNOSIS — R262 Difficulty in walking, not elsewhere classified: Secondary | ICD-10-CM

## 2020-12-23 NOTE — Therapy (Signed)
[facility] [address]  [place], [place], [address] Phone: [phone]   Fax:  [phone]  Physical Therapy Treatment  Patient Details  Name: Shelby Newton MRN: [id number] Date of Birth: 03/23/1946 Referring Provider (PT): Trula Slade, DPM   Encounter Date: 12/23/2020   PT End of Session - 12/23/20 1149     Visit Number 5    Number of Visits 16    Date for PT Re-Evaluation 01/19/21    Authorization Type Medicare    Progress Note Due on Visit 30    PT Start Time 1101    PT Stop Time 1143    PT Time Calculation (min) 42 min    Activity Tolerance Patient tolerated treatment well    Behavior During Therapy WFL for tasks assessed/performed             Past Medical History:  Diagnosis Date   Arthritis    "ALITTLE IN HER BACK"   GERD (gastroesophageal reflux disease)    TAKES ZANTAC PRIOR TO TOMATOES   H/O seasonal allergies    Hypertension    ON MEDS 10-12 YRS NOW    Past Surgical History:  Procedure Laterality Date   ABDOMINAL HYSTERECTOMY     APPENDECTOMY     TRANSFORAMINAL LUMBAR INTERBODY FUSION (TLIF) WITH PEDICLE SCREW FIXATION 1 LEVEL N/A 08/20/2012   Procedure: TRANSFORAMINAL LUMBAR INTERBODY FUSION (TLIF) WITH PEDICLE SCREW FIXATION ONE LEVEL;  Surgeon: Ophelia Charter, MD;  Location: [facility];  Service: Neurosurgery;  Laterality: N/A;  Posteior Lumbar Four-Five Transforaminal Interbody and Fusion with Interbody Prothesis, Posterolateral Arthrodesis, and Posterior Nonsegmental Instrumentation    There were no vitals filed for this visit.   Subjective Assessment - 12/23/20 1102     Subjective Pt reports being sore after HEP.    Patient Stated Goals "to be able to stand and walk w/o pain"    Currently in Pain? No/denies                               [facility] Adult PT Treatment/Exercise - 12/23/20 0001       Exercises   Exercises Ankle      Knee/Hip  Exercises: Supine   Bridges Strengthening;Both;2 sets;10 reps      Knee/Hip Exercises: Sidelying   Clams R/L red TB clam 2x10      Ankle Exercises: Seated   Towel Inversion/Eversion --   EV/IV 20 reps with each LE   Other Seated Ankle Exercises R/L red TB 3-way  ankle 2 x 10    Other Seated Ankle Exercises B toe curls 20 reps      Ankle Exercises: Sidelying   Ankle Eversion Strengthening;Both;20 reps   2x10     Ankle Exercises: Aerobic   Recumbent Bike L2 x 6 min      Ankle Exercises: Standing   Heel Raises Both;20 reps    Heel Raises Limitations UE support    Other Standing Ankle Exercises church pews with UE support 2x10                       PT Short Term Goals - 12/11/20 1112       PT SHORT TERM GOAL #1   Title Patient will be independent with initial HEP    Status Achieved   12/11/20  PT Long Term Goals - 12/11/20 1113       PT LONG TERM GOAL #1   Title Patient will be independent with ongoing/advanced HEP for self-management at home in order to build upon functional gains in therapy    Status On-going    Target Date 01/19/21      PT LONG TERM GOAL #2   Title Decrease B foot/ankle pain by >/= 50-75% allowing patient improved standing and walking tolerance    Status Partially Met   12/11/20 - Pt reporting 30-35% reduciton in pain   Target Date 01/19/21      PT LONG TERM GOAL #3   Title Patient to improve B ankle AROM to [facility] without pain provocation    Status Partially Met   12/11/20 - B ankle ROM WFL but still with some pain at extremes of ROM   Target Date 01/19/21      PT LONG TERM GOAL #4   Title Patient will demonstrate improved B LE strength to >/= 4 to 4+/5 for improved stability and ease of mobility    Status Partially Met    Target Date 01/19/21      PT LONG TERM GOAL #5   Title Patient will improve standing and/or walking tolerance to >/= 30 minutes w/o pain interference to allow resumption of normal daily activities     Status On-going    Target Date 01/19/21                   Plan - 12/23/20 1150     Clinical Impression Statement Pt showed a good demontrating of the exercises once instructed on technique. Attempted toe yoga but she was limited by lack of mobility. Most instructions required with set up for ankle TB exercises. We were able to progress exercises today w/o reports of pain. Incorporated church pew exercise today to work on [employer] and to facilitate the co-contraction of balance muscles, to improve ankle propriception. Pt responded well, could progress to more standing ther ex.    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, GERD, allergies    PT Frequency 2x / week    PT Duration 8 weeks    PT Treatment/Interventions ADLs/Self Care Home Management;Cryotherapy;Electrical Stimulation;Iontophoresis 37m/ml Dexamethasone;Moist Heat;Ultrasound;Gait training;Stair training;Functional mobility training;Therapeutic activities;Therapeutic exercise;Balance training;Neuromuscular re-education;Patient/family education;Manual techniques;Passive range of motion;Dry needling;Taping;Vasopneumatic Device;Joint Manipulations    PT Next Visit Plan progress foot intrinsic & ankle strengthening; core/proximal LE strengthening; balance & proprioceptive training; manual therapy and modalities PRN    PT Home Exercise Plan Access Code: [email or web address](8/16, updated 9/2 & 9/7)    Consulted and Agree with Plan of Care Patient             Patient will benefit from skilled therapeutic intervention in order to improve the following deficits and impairments:  Abnormal gait, Decreased activity tolerance, Decreased balance, Decreased endurance, Decreased mobility, Decreased range of motion, Decreased strength, Difficulty walking, Increased edema, Increased fascial restricitons, Increased  muscle spasms, Impaired perceived functional ability, Impaired flexibility, Improper body mechanics, Postural dysfunction, Pain  Visit Diagnosis: Pain in right ankle and joints of right foot  Pain in left foot  Difficulty in walking, not elsewhere classified  Other abnormalities of gait and mobility  Muscle weakness (generalized)  Localized edema     Problem List There are no problems to display for this patient.   BArtist Pais PTA 12/23/2020, 12:03 PM  [facility] [address]  [place], [place], [address] Phone: [phone]   Fax:  [phone]  Name: LEATRICE PARILLA MRN: [id number] Date of Birth: May 26, 1945

## 2020-12-25 ENCOUNTER — Encounter: Payer: Self-pay | Admitting: Physical Therapy

## 2020-12-25 ENCOUNTER — Ambulatory Visit: Payer: Medicare Other | Admitting: Physical Therapy

## 2020-12-25 ENCOUNTER — Other Ambulatory Visit: Payer: Self-pay

## 2020-12-25 DIAGNOSIS — M25571 Pain in right ankle and joints of right foot: Secondary | ICD-10-CM | POA: Diagnosis not present

## 2020-12-25 DIAGNOSIS — R600 Localized edema: Secondary | ICD-10-CM

## 2020-12-25 DIAGNOSIS — R262 Difficulty in walking, not elsewhere classified: Secondary | ICD-10-CM

## 2020-12-25 DIAGNOSIS — R2689 Other abnormalities of gait and mobility: Secondary | ICD-10-CM

## 2020-12-25 DIAGNOSIS — M79672 Pain in left foot: Secondary | ICD-10-CM

## 2020-12-25 DIAGNOSIS — M6281 Muscle weakness (generalized): Secondary | ICD-10-CM

## 2020-12-25 NOTE — Therapy (Signed)
[facility] [address]  [place], [place], [address] Phone: [phone]   Fax:  [phone]  Physical Therapy Treatment  Patient Details  Name: Shelby Newton MRN: [id number] Date of Birth: 12-16-1945 Referring Provider (PT): Trula Slade, DPM   Encounter Date: 12/25/2020   PT End of Session - 12/25/20 1105     Visit Number 6    Number of Visits 16    Date for PT Re-Evaluation 01/19/21    Authorization Type Medicare    Progress Note Due on Visit 13    PT Start Time 1105    PT Stop Time 1146    PT Time Calculation (min) 41 min    Activity Tolerance Patient tolerated treatment well    Behavior During Therapy WFL for tasks assessed/performed             Past Medical History:  Diagnosis Date   Arthritis    "ALITTLE IN HER BACK"   GERD (gastroesophageal reflux disease)    TAKES ZANTAC PRIOR TO TOMATOES   H/O seasonal allergies    Hypertension    ON MEDS 10-12 YRS NOW    Past Surgical History:  Procedure Laterality Date   ABDOMINAL HYSTERECTOMY     APPENDECTOMY     TRANSFORAMINAL LUMBAR INTERBODY FUSION (TLIF) WITH PEDICLE SCREW FIXATION 1 LEVEL N/A 08/20/2012   Procedure: TRANSFORAMINAL LUMBAR INTERBODY FUSION (TLIF) WITH PEDICLE SCREW FIXATION ONE LEVEL;  Surgeon: Ophelia Charter, MD;  Location: [place] [facility];  Service: Neurosurgery;  Laterality: N/A;  Posteior Lumbar Four-Five Transforaminal Interbody and Fusion with Interbody Prothesis, Posterolateral Arthrodesis, and Posterior Nonsegmental Instrumentation    There were no vitals filed for this visit.   Subjective Assessment - 12/25/20 1109     Subjective Pt reports her heel is still tender when she has to stand w/o shoes such as when showering, but otherwise "can't complain about pain".    How long can you stand comfortably? >30 minutes    How long can you walk comfortably? at least 20 minutes    Patient Stated Goals "to be able to  stand and walk w/o pain"    Currently in Pain? No/denies                [facility] PT Assessment - 12/25/20 1105       Assessment   Medical Diagnosis R ankle tendonitis & L plantar fasciitis    Referring Provider (PT) Trula Slade, DPM    Onset Date/Surgical Date --   L - March 2022; R - May 2022   Next MD Visit 02/23/21                           [facility] Adult PT Treatment/Exercise - 12/25/20 1105       Exercises   Exercises Ankle      Knee/Hip Exercises: Standing   Functional Squat 2 sets;10 reps;3 seconds    Functional Squat Limitations 2nd set + triple extension; UE support on edge of sink    Other Standing Knee Exercises B red TB side-stepping & fwd/back monster walk 2 x 10 ft along counter      Ankle Exercises: Aerobic   Recumbent Bike L2 x 6 min      Ankle Exercises: Standing   Balance Beam Fwd/back tandem and side-stepping on foam balance beam x 4 passes; B Side-stepping with forefoot & heels on beam x 4 passes  intermittent UE support on counter   Other Standing Ankle Exercises R/L SLS (toe down on opp LE for balance) medial & lateral red TB pallof press x 10 each                       PT Short Term Goals - 12/11/20 1112       PT SHORT TERM GOAL #1   Title Patient will be independent with initial HEP    Status Achieved   12/11/20              PT Long Term Goals - 12/25/20 1111       PT LONG TERM GOAL #1   Title Patient will be independent with ongoing/advanced HEP for self-management at home in order to build upon functional gains in therapy    Status Partially Met    Target Date 01/19/21      PT LONG TERM GOAL #2   Title Decrease B foot/ankle pain by >/= 50-75% allowing patient improved standing and walking tolerance    Status Achieved   12/25/20 - Pt reporting 50-60% reduciton in pain     PT LONG TERM GOAL #3   Title Patient to improve B ankle AROM to [facility] without pain provocation    Status Partially Met   12/11/20 -  B ankle ROM WFL but still with some pain at extremes of ROM   Target Date 01/19/21      PT LONG TERM GOAL #4   Title Patient will demonstrate improved B LE strength to >/= 4 to 4+/5 for improved stability and ease of mobility    Status Partially Met    Target Date 01/19/21      PT LONG TERM GOAL #5   Title Patient will improve standing and/or walking tolerance to >/= 30 minutes w/o pain interference to allow resumption of normal daily activities    Status Partially Met   12/25/20 - standing tolerance >30 minute and walking at least 20 minutes   Target Date 01/19/21                   Plan - 12/25/20 1117     Clinical Impression Statement Tyna reports 50-60% improvement in pain thus far with improving standing and walking tolerance, only noting difficulty when she has to stand in bare feet such as when showering - LTG #2 now met and #5 now partially met. She is currently scheduled through the end of the month and feels like she should be ready to transition to her HEP at that time. She states she would like to continue to progress proximal LE strengthening as she feels that this has been helping her feet and ankles, therefore therapeutic exercises and activities today incorporating significant proximal strengthening as well ankle strengthening and balance/proprioceptive training all in standing. Pt acknowledging a good workout with no increased pain today - will consider updating HEP with some of new exercises on next visit.    Comorbidities HTN, OA, LBP s/p L4-5 fusion 2014, GERD, allergies    Rehab Potential Good    PT Frequency 2x / week    PT Duration 8 weeks    PT Treatment/Interventions ADLs/Self Care Home Management;Cryotherapy;Electrical Stimulation;Iontophoresis 39m/ml Dexamethasone;Moist Heat;Ultrasound;Gait training;Stair training;Functional mobility training;Therapeutic activities;Therapeutic exercise;Balance training;Neuromuscular re-education;Patient/family  education;Manual techniques;Passive range of motion;Dry needling;Taping;Vasopneumatic Device;Joint Manipulations    PT Next Visit Plan progress foot intrinsic & ankle strengthening; core/proximal LE strengthening; balance & proprioceptive training; HEP updates as indicated in  prep for anticipated transition to HEP at the end of the month; manual therapy and modalities PRN    PT Home Exercise Plan Access Code: [email or web address] (8/16, updated 9/2 & 9/7)    Consulted and Agree with Plan of Care Patient             Patient will benefit from skilled therapeutic intervention in order to improve the following deficits and impairments:  Abnormal gait, Decreased activity tolerance, Decreased balance, Decreased endurance, Decreased mobility, Decreased range of motion, Decreased strength, Difficulty walking, Increased edema, Increased fascial restricitons, Increased muscle spasms, Impaired perceived functional ability, Impaired flexibility, Improper body mechanics, Postural dysfunction, Pain  Visit Diagnosis: Pain in right ankle and joints of right foot  Pain in left foot  Difficulty in walking, not elsewhere classified  Other abnormalities of gait and mobility  Muscle weakness (generalized)  Localized edema     Problem List There are no problems to display for this patient.   Percival Spanish, PT 12/25/2020, 12:05 PM  [facility] [address]  [place], [place], [address] Phone: [phone]   Fax:  [phone]  Name: Shelby Newton MRN: [id number] Date of Birth: [birth year]

## 2020-12-29 ENCOUNTER — Other Ambulatory Visit: Payer: Self-pay

## 2020-12-29 ENCOUNTER — Ambulatory Visit: Payer: Medicare Other

## 2020-12-29 DIAGNOSIS — M79672 Pain in left foot: Secondary | ICD-10-CM

## 2020-12-29 DIAGNOSIS — R262 Difficulty in walking, not elsewhere classified: Secondary | ICD-10-CM

## 2020-12-29 DIAGNOSIS — R2689 Other abnormalities of gait and mobility: Secondary | ICD-10-CM

## 2020-12-29 DIAGNOSIS — M6281 Muscle weakness (generalized): Secondary | ICD-10-CM

## 2020-12-29 DIAGNOSIS — R600 Localized edema: Secondary | ICD-10-CM

## 2020-12-29 DIAGNOSIS — M25571 Pain in right ankle and joints of right foot: Secondary | ICD-10-CM | POA: Diagnosis not present

## 2020-12-29 NOTE — Therapy (Signed)
[facility] [address]  [place], [place], [address] Phone: [phone]   Fax:  [phone]  Physical Therapy Treatment  Patient Details  Name: Shelby Newton MRN: [id number] Date of Birth: October 12, 1945 Referring Provider (PT): Trula Slade, DPM   Encounter Date: 12/29/2020   PT End of Session - 12/29/20 1149     Visit Number 7    Number of Visits 16    Date for PT Re-Evaluation 01/19/21    Authorization Type Medicare    Progress Note Due on Visit 13    PT Start Time 1105    PT Stop Time 1791    PT Time Calculation (min) 40 min    Activity Tolerance Patient tolerated treatment well    Behavior During Therapy WFL for tasks assessed/performed             Past Medical History:  Diagnosis Date   Arthritis    "ALITTLE IN HER BACK"   GERD (gastroesophageal reflux disease)    TAKES ZANTAC PRIOR TO TOMATOES   H/O seasonal allergies    Hypertension    ON MEDS 10-12 YRS NOW    Past Surgical History:  Procedure Laterality Date   ABDOMINAL HYSTERECTOMY     APPENDECTOMY     TRANSFORAMINAL LUMBAR INTERBODY FUSION (TLIF) WITH PEDICLE SCREW FIXATION 1 LEVEL N/A 08/20/2012   Procedure: TRANSFORAMINAL LUMBAR INTERBODY FUSION (TLIF) WITH PEDICLE SCREW FIXATION ONE LEVEL;  Surgeon: Ophelia Charter, MD;  Location: [place] [facility];  Service: Neurosurgery;  Laterality: N/A;  Posteior Lumbar Four-Five Transforaminal Interbody and Fusion with Interbody Prothesis, Posterolateral Arthrodesis, and Posterior Nonsegmental Instrumentation    There were no vitals filed for this visit.   Subjective Assessment - 12/29/20 1107     Subjective Feels like she stretched a little too hard on Friday, wants to take it easy today.    Patient Stated Goals "to be able to stand and walk w/o pain"    Currently in Pain? No/denies                               [facility] Adult PT Treatment/Exercise - 12/29/20 0001        Manual Therapy   Manual Therapy Soft tissue mobilization    Soft tissue mobilization STM to B plantar fascia, retrograde massage for edema      Ankle Exercises: Aerobic   Recumbent Bike L2 x 6 min      Ankle Exercises: Stretches   Gastroc Stretch 2 reps;30 seconds    Gastroc Stretch Limitations long sit with strap      Ankle Exercises: Seated   Other Seated Ankle Exercises B seated EV/IV 2x10                       PT Short Term Goals - 12/11/20 1112       PT SHORT TERM GOAL #1   Title Patient will be independent with initial HEP    Status Achieved   12/11/20              PT Long Term Goals - 12/25/20 1111       PT LONG TERM GOAL #1   Title Patient will be independent with ongoing/advanced HEP for self-management at home in order to build upon functional gains in therapy    Status Partially Met    Target Date 01/19/21  PT LONG TERM GOAL #2   Title Decrease B foot/ankle pain by >/= 50-75% allowing patient improved standing and walking tolerance    Status Achieved   12/25/20 - Pt reporting 50-60% reduciton in pain     PT LONG TERM GOAL #3   Title Patient to improve B ankle AROM to [facility] without pain provocation    Status Partially Met   12/11/20 - B ankle ROM WFL but still with some pain at extremes of ROM   Target Date 01/19/21      PT LONG TERM GOAL #4   Title Patient will demonstrate improved B LE strength to >/= 4 to 4+/5 for improved stability and ease of mobility    Status Partially Met    Target Date 01/19/21      PT LONG TERM GOAL #5   Title Patient will improve standing and/or walking tolerance to >/= 30 minutes w/o pain interference to allow resumption of normal daily activities    Status Partially Met   12/25/20 - standing tolerance >30 minute and walking at least 20 minutes   Target Date 01/19/21                   Plan - 12/29/20 1149     Clinical Impression Statement Pt noted that she has had more tenderness in both feet  since the last session. Due to this report we focused most of the session on STM to address tightness and soft tissue restrictions. Also followed with retrograde massage due to her reporting increase swelling in both feet. Pt reported much improvement post MT. Will plan to update HEP next visit if no further complaints of increased tenderness along the muscles of the foot.    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, GERD, allergies    PT Frequency 2x / week    PT Duration 8 weeks    PT Treatment/Interventions ADLs/Self Care Home Management;Cryotherapy;Electrical Stimulation;Iontophoresis 4mg /ml Dexamethasone;Moist Heat;Ultrasound;Gait training;Stair training;Functional mobility training;Therapeutic activities;Therapeutic exercise;Balance training;Neuromuscular re-education;Patient/family education;Manual techniques;Passive range of motion;Dry needling;Taping;Vasopneumatic Device;Joint Manipulations    PT Next Visit Plan progress foot intrinsic & ankle strengthening; core/proximal LE strengthening; balance & proprioceptive training; HEP updates as indicated in prep for anticipated transition to HEP at the end of the month; manual therapy and modalities PRN    PT Home Exercise Plan Access Code: [email or web address] (8/16, updated 9/2 & 9/7)    Consulted and Agree with Plan of Care Patient             Patient will benefit from skilled therapeutic intervention in order to improve the following deficits and impairments:  Abnormal gait, Decreased activity tolerance, Decreased balance, Decreased endurance, Decreased mobility, Decreased range of motion, Decreased strength, Difficulty walking, Increased edema, Increased fascial restricitons, Increased muscle spasms, Impaired perceived functional ability, Impaired flexibility, Improper body mechanics, Postural dysfunction,  Pain  Visit Diagnosis: Pain in right ankle and joints of right foot  Pain in left foot  Difficulty in walking, not elsewhere classified  Other abnormalities of gait and mobility  Muscle weakness (generalized)  Localized edema     Problem List There are no problems to display for this patient.   Artist Pais, PTA 12/29/2020, 12:02 PM  [facility] [address]  [place], [place], [address] Phone: [phone]   Fax:  [phone]  Name: Shelby Newton MRN: [id number] Date of  Birth: 1945-06-27

## 2021-01-01 ENCOUNTER — Other Ambulatory Visit: Payer: Self-pay

## 2021-01-01 ENCOUNTER — Ambulatory Visit: Payer: Medicare Other

## 2021-01-01 DIAGNOSIS — R600 Localized edema: Secondary | ICD-10-CM

## 2021-01-01 DIAGNOSIS — M6281 Muscle weakness (generalized): Secondary | ICD-10-CM

## 2021-01-01 DIAGNOSIS — R2689 Other abnormalities of gait and mobility: Secondary | ICD-10-CM

## 2021-01-01 DIAGNOSIS — M79672 Pain in left foot: Secondary | ICD-10-CM

## 2021-01-01 DIAGNOSIS — M25571 Pain in right ankle and joints of right foot: Secondary | ICD-10-CM | POA: Diagnosis not present

## 2021-01-01 DIAGNOSIS — R262 Difficulty in walking, not elsewhere classified: Secondary | ICD-10-CM

## 2021-01-01 NOTE — Therapy (Signed)
[facility] [address]  [place], [place], [address] Phone: [phone]   Fax:  [phone]  Physical Therapy Treatment  Patient Details  Name: Shelby Newton MRN: [id number] Date of Birth: 03/14/1946 Referring Provider (PT): Trula Slade, DPM   Encounter Date: 01/01/2021   PT End of Session - 01/01/21 1102     Visit Number 8    Number of Visits 16    Date for PT Re-Evaluation 01/19/21    Authorization Type Medicare    Progress Note Due on Visit 32    PT Start Time 1017    PT Stop Time 1058    PT Time Calculation (min) 41 min    Activity Tolerance Patient tolerated treatment well    Behavior During Therapy WFL for tasks assessed/performed             Past Medical History:  Diagnosis Date   Arthritis    "ALITTLE IN HER BACK"   GERD (gastroesophageal reflux disease)    TAKES ZANTAC PRIOR TO TOMATOES   H/O seasonal allergies    Hypertension    ON MEDS 10-12 YRS NOW    Past Surgical History:  Procedure Laterality Date   ABDOMINAL HYSTERECTOMY     APPENDECTOMY     TRANSFORAMINAL LUMBAR INTERBODY FUSION (TLIF) WITH PEDICLE SCREW FIXATION 1 LEVEL N/A 08/20/2012   Procedure: TRANSFORAMINAL LUMBAR INTERBODY FUSION (TLIF) WITH PEDICLE SCREW FIXATION ONE LEVEL;  Surgeon: Ophelia Charter, MD;  Location: [facility];  Service: Neurosurgery;  Laterality: N/A;  Posteior Lumbar Four-Five Transforaminal Interbody and Fusion with Interbody Prothesis, Posterolateral Arthrodesis, and Posterior Nonsegmental Instrumentation    There were no vitals filed for this visit.   Subjective Assessment - 01/01/21 1018     Subjective Pt feels lke she is doing better today, still a little tenderness on the inner portions of her ankles.    Patient Stated Goals "to be able to stand and walk w/o pain"    Currently in Pain? No/denies                               [facility] Adult PT Treatment/Exercise  - 01/01/21 0001       Self-Care   Self-Care Posture    Posture edu on importance of glute strengthening to prevent ER at the tibia for nuetral alignment      Exercises   Exercises Lumbar;Knee/Hip;Ankle      Lumbar Exercises: Standing   Other Standing Lumbar Exercises bird dog with counter support 2x10      Knee/Hip Exercises: Standing   Hip Abduction Stengthening;Both;10 reps    Abduction Limitations counter support      Knee/Hip Exercises: Supine   Bridges Strengthening;Both;2 sets;10 reps    Bridges Limitations red TB and green TB      Knee/Hip Exercises: Sidelying   Hip ABduction Strengthening;Both;10 reps    Hip ABduction Limitations red TB at knees    Clams R/L clams red TB 10x3"      Ankle Exercises: Aerobic   Recumbent Bike L2 x 6 min                       PT Short Term Goals - 12/11/20 1112       PT SHORT TERM GOAL #1   Title Patient will be independent with initial HEP    Status Achieved   12/11/20  PT Long Term Goals - 12/25/20 1111       PT LONG TERM GOAL #1   Title Patient will be independent with ongoing/advanced HEP for self-management at home in order to build upon functional gains in therapy    Status Partially Met    Target Date 01/19/21      PT LONG TERM GOAL #2   Title Decrease B foot/ankle pain by >/= 50-75% allowing patient improved standing and walking tolerance    Status Achieved   12/25/20 - Pt reporting 50-60% reduciton in pain     PT LONG TERM GOAL #3   Title Patient to improve B ankle AROM to [facility] without pain provocation    Status Partially Met   12/11/20 - B ankle ROM WFL but still with some pain at extremes of ROM   Target Date 01/19/21      PT LONG TERM GOAL #4   Title Patient will demonstrate improved B LE strength to >/= 4 to 4+/5 for improved stability and ease of mobility    Status Partially Met    Target Date 01/19/21      PT LONG TERM GOAL #5   Title Patient will improve standing and/or walking  tolerance to >/= 30 minutes w/o pain interference to allow resumption of normal daily activities    Status Partially Met   12/25/20 - standing tolerance >30 minute and walking at least 20 minutes   Target Date 01/19/21                   Plan - 01/01/21 1103     Clinical Impression Statement Pt reportedly has been doing better since the last session with less tenderness in her feet. She demonstrates excessive ER on the R tibia so added more glute strengthening and progressed clamshells. Incorporated more dynamic strengthening exercises to improve core stabilization for proximal stability during weight bearing positions. Cues given as needed with exercises for instruction and max benefit.    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, GERD, allergies    PT Frequency 2x / week    PT Duration 8 weeks    PT Treatment/Interventions ADLs/Self Care Home Management;Cryotherapy;Electrical Stimulation;Iontophoresis 4mg /ml Dexamethasone;Moist Heat;Ultrasound;Gait training;Stair training;Functional mobility training;Therapeutic activities;Therapeutic exercise;Balance training;Neuromuscular re-education;Patient/family education;Manual techniques;Passive range of motion;Dry needling;Taping;Vasopneumatic Device;Joint Manipulations    PT Next Visit Plan progress foot intrinsic & ankle strengthening; core/proximal LE strengthening; balance & proprioceptive training; HEP updates as indicated in prep for anticipated transition to HEP at the end of the month; manual therapy and modalities PRN    PT Home Exercise Plan Access Code: [email or web address] (8/16, updated 9/2 & 9/7)    Consulted and Agree with Plan of Care Patient             Patient will benefit from skilled therapeutic intervention in order to improve the following deficits and impairments:  Abnormal gait, Decreased  activity tolerance, Decreased balance, Decreased endurance, Decreased mobility, Decreased range of motion, Decreased strength, Difficulty walking, Increased edema, Increased fascial restricitons, Increased muscle spasms, Impaired perceived functional ability, Impaired flexibility, Improper body mechanics, Postural dysfunction, Pain  Visit Diagnosis: Pain in right ankle and joints of right foot  Pain in left foot  Difficulty in walking, not elsewhere classified  Other abnormalities of gait and mobility  Muscle weakness (generalized)  Localized edema     Problem List There are no problems to display for this patient.  Artist Pais, PTA 01/01/2021, 12:04 PM  [facility] [address]  [place], [place], [address] Phone: [phone]   Fax:  [phone]  Name: Shelby Newton MRN: [id number] Date of Birth: 09-21-1945

## 2021-01-06 ENCOUNTER — Ambulatory Visit: Payer: Medicare Other

## 2021-01-06 ENCOUNTER — Other Ambulatory Visit: Payer: Self-pay

## 2021-01-06 DIAGNOSIS — M25571 Pain in right ankle and joints of right foot: Secondary | ICD-10-CM

## 2021-01-06 DIAGNOSIS — M6281 Muscle weakness (generalized): Secondary | ICD-10-CM

## 2021-01-06 DIAGNOSIS — R2689 Other abnormalities of gait and mobility: Secondary | ICD-10-CM

## 2021-01-06 DIAGNOSIS — M79672 Pain in left foot: Secondary | ICD-10-CM

## 2021-01-06 DIAGNOSIS — R262 Difficulty in walking, not elsewhere classified: Secondary | ICD-10-CM

## 2021-01-06 DIAGNOSIS — R600 Localized edema: Secondary | ICD-10-CM

## 2021-01-06 NOTE — Therapy (Signed)
[facility] [address]  [place], [place], [address] Phone: [phone]   Fax:  [phone]  Physical Therapy Treatment  Patient Details  Name: Shelby Newton MRN: [id number] Date of Birth: [birth year] Referring Provider (PT): Trula Slade, DPM   Encounter Date: 01/06/2021   PT End of Session - 01/06/21 1157     Visit Number 9    Number of Visits 16    Date for PT Re-Evaluation 01/19/21    Authorization Type Medicare    Progress Note Due on Visit 13    PT Start Time 1104    PT Stop Time 1146    PT Time Calculation (min) 42 min    Activity Tolerance Patient tolerated treatment well    Behavior During Therapy WFL for tasks assessed/performed             Past Medical History:  Diagnosis Date   Arthritis    "ALITTLE IN HER BACK"   GERD (gastroesophageal reflux disease)    TAKES ZANTAC PRIOR TO TOMATOES   H/O seasonal allergies    Hypertension    ON MEDS 10-12 YRS NOW    Past Surgical History:  Procedure Laterality Date   ABDOMINAL HYSTERECTOMY     APPENDECTOMY     TRANSFORAMINAL LUMBAR INTERBODY FUSION (TLIF) WITH PEDICLE SCREW FIXATION 1 LEVEL N/A 08/20/2012   Procedure: TRANSFORAMINAL LUMBAR INTERBODY FUSION (TLIF) WITH PEDICLE SCREW FIXATION ONE LEVEL;  Surgeon: Ophelia Charter, MD;  Location: [facility];  Service: Neurosurgery;  Laterality: N/A;  Posteior Lumbar Four-Five Transforaminal Interbody and Fusion with Interbody Prothesis, Posterolateral Arthrodesis, and Posterior Nonsegmental Instrumentation    There were no vitals filed for this visit.   Subjective Assessment - 01/06/21 1115     Subjective Pt reports that she is experiencing some soreness from her exercises last week.    Patient Stated Goals "to be able to stand and walk w/o pain"    Currently in Pain? No/denies                [facility] PT Assessment - 01/06/21 0001       AROM   Right Ankle Dorsiflexion 15     Right Ankle Inversion 30    Right Ankle Eversion 26    Left Ankle Dorsiflexion 15    Left Ankle Inversion 32    Left Ankle Eversion 29                           [facility] Adult PT Treatment/Exercise - 01/06/21 0001       Exercises   Exercises Lumbar;Knee/Hip;Ankle      Lumbar Exercises: Standing   Other Standing Lumbar Exercises bird dog with counter support 2x10      Lumbar Exercises: Supine   Clam 20 reps;3 seconds    Clam Limitations green TB; SL fallout      Knee/Hip Exercises: Standing   Hip Abduction Stengthening;Both;2 sets;10 reps    Abduction Limitations counter support      Knee/Hip Exercises: Supine   Bridges Strengthening;Both;2 sets;10 reps    Bridges Limitations green TB      Knee/Hip Exercises: Sidelying   Clams R/L reverse clams 10x each                       PT Short Term Goals - 12/11/20 1112       PT SHORT TERM GOAL #1  Title Patient will be independent with initial HEP    Status Achieved   12/11/20              PT Long Term Goals - 01/06/21 1129       PT LONG TERM GOAL #1   Title Patient will be independent with ongoing/advanced HEP for self-management at home in order to build upon functional gains in therapy    Status Partially Met      PT LONG TERM GOAL #2   Title Decrease B foot/ankle pain by >/= 50-75% allowing patient improved standing and walking tolerance    Status Achieved   12/25/20 - Pt reporting 50-60% reduciton in pain     PT LONG TERM GOAL #3   Title Patient to improve B ankle AROM to [facility] without pain provocation    Status Achieved   pt reports no functional limitations from ankle AROM and no pain     PT LONG TERM GOAL #4   Title Patient will demonstrate improved B LE strength to >/= 4 to 4+/5 for improved stability and ease of mobility    Status Partially Met      PT LONG TERM GOAL #5   Title Patient will improve standing and/or walking tolerance to >/= 30 minutes w/o pain interference to  allow resumption of normal daily activities    Status Partially Met   12/25/20 - standing tolerance >30 minute and walking at least 20 minutes                  Plan - 01/06/21 1158     Clinical Impression Statement Pt has met LTG 3 with no reported functional limitation from restricted motion in her ankles. We took some time to discuss her objective progress and plan for the rest of her POC. She reported that she has been able to tolerate standing for longer periods of time w/o requiring breaks with cooking and cleaning at home. She did note some soreness today but that her new exercises in standing have been more of a challenge. Worked more on glute strengthening due to the R genu valgus found last session, she demonstrated good tolerance for these exercises. She plans to wrap up with PT next visit and transition to home program.    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, GERD, allergies    PT Frequency 2x / week    PT Duration 8 weeks    PT Treatment/Interventions ADLs/Self Care Home Management;Cryotherapy;Electrical Stimulation;Iontophoresis 4mg /ml Dexamethasone;Moist Heat;Ultrasound;Gait training;Stair training;Functional mobility training;Therapeutic activities;Therapeutic exercise;Balance training;Neuromuscular re-education;Patient/family education;Manual techniques;Passive range of motion;Dry needling;Taping;Vasopneumatic Device;Joint Manipulations    PT Next Visit Plan progress foot intrinsic & ankle strengthening; core/proximal LE strengthening; balance & proprioceptive training; HEP updates as indicated in prep for anticipated transition to HEP at the end of the month; manual therapy and modalities PRN    PT Home Exercise Plan Access Code: [email or web address] (8/16, updated 9/2 & 9/7)    Consulted and Agree with Plan of Care Patient              Patient will benefit from skilled therapeutic intervention in order to improve the following deficits and impairments:  Abnormal gait, Decreased activity tolerance, Decreased balance, Decreased endurance, Decreased mobility, Decreased range of motion, Decreased strength, Difficulty walking, Increased edema, Increased fascial restricitons, Increased muscle spasms, Impaired perceived functional ability, Impaired flexibility, Improper body mechanics, Postural dysfunction, Pain  Visit  Diagnosis: Pain in right ankle and joints of right foot  Pain in left foot  Difficulty in walking, not elsewhere classified  Other abnormalities of gait and mobility  Muscle weakness (generalized)  Localized edema     Problem List There are no problems to display for this patient.   Artist Pais, PTA 01/06/2021, 12:05 PM  [facility] [address]  [place], [place], [address] Phone: [phone]   Fax:  [phone]  Name: Shelby Newton MRN: [id number] Date of Birth: February 18, 1946

## 2021-01-08 ENCOUNTER — Other Ambulatory Visit: Payer: Self-pay

## 2021-01-08 ENCOUNTER — Ambulatory Visit: Payer: Medicare Other | Admitting: Physical Therapy

## 2021-01-08 ENCOUNTER — Encounter: Payer: Self-pay | Admitting: Physical Therapy

## 2021-01-08 DIAGNOSIS — M25571 Pain in right ankle and joints of right foot: Secondary | ICD-10-CM

## 2021-01-08 DIAGNOSIS — M79672 Pain in left foot: Secondary | ICD-10-CM

## 2021-01-08 DIAGNOSIS — R600 Localized edema: Secondary | ICD-10-CM

## 2021-01-08 DIAGNOSIS — R2689 Other abnormalities of gait and mobility: Secondary | ICD-10-CM

## 2021-01-08 DIAGNOSIS — R262 Difficulty in walking, not elsewhere classified: Secondary | ICD-10-CM

## 2021-01-08 DIAGNOSIS — M6281 Muscle weakness (generalized): Secondary | ICD-10-CM

## 2021-01-08 NOTE — Patient Instructions (Signed)
  Access Code: [email or web address] URL: [email or web address] Date: 01/08/2021 Prepared by: Glenetta Hew  Exercises Hooklying Hamstring Stretch with Strap - 1 x daily - 7 x weekly - 3 reps - 30 sec hold Supine ITB Stretch with Strap - 1 x daily - 7 x weekly - 3 reps - 30 sec hold Supine Quadriceps Stretch with Strap on Table - 1 x daily - 7 x weekly - 3 reps - 30 sec hold Long Sitting Plantar Fascia Stretch with Towel - 1 x daily - 7 x weekly - 3 reps - 30 sec hold Long Sitting Calf Stretch with Strap - 1 x daily - 7 x weekly - 3 reps - 30 sec hold Foot Roller Plantar Massage - 1 x daily - 7 x weekly - 2-3 min hold Seated Hip Flexor Stretch - 1 x daily - 7 x weekly - 3 reps - 30 sec hold Hooklying Isometric Clamshell - 1 x daily - 3 x weekly - 2 sets - 10 reps - 3 sec hold Supine Bridge with Resistance Band - 1 x daily - 3 x weekly - 2 sets - 10 reps - 5 sec hold Clam with Resistance - 1 x daily - 3 x weekly - 2 sets - 10 reps - 3-5 sec hold Sidelying Hip Abduction - 1 x daily - 3 x weekly - 2 sets - 10 reps - 3-5 sec hold Seated Ankle Plantar Flexion with Resistance Loop - 1 x daily - 5 x weekly - 2 sets - 10 reps - 3 sec hold Seated Ankle Dorsiflexion with Resistance - 1 x daily - 5 x weekly - 2 sets - 10 reps - 3 sec hold Seated Ankle Eversion with Resistance - 1 x daily - 5 x weekly - 2 sets - 10 reps - 3 sec hold Seated Ankle Inversion with Resistance - 1 x daily - 5 x weekly - 2 sets - 10 reps - 3 sec hold Toe Flexion with Resistance - 1 x daily - 5 x weekly - 2 sets - 10 reps - 3 sec hold Bird Dog on Counter - 1 x daily - 3 x weekly - 3 sets - 10 reps Standing Hip Abduction with Counter Support - 1 x daily - 3 x weekly - 3 sets - 10 reps Eccentric Heel Lowering on Step - 1 x daily - 3 x weekly - 2 sets - 10 reps - 3-5 sec hold  Patient Education Ice Massage

## 2021-01-08 NOTE — Therapy (Signed)
[facility] [address]  [place], [place], [address] Phone: [phone]   Fax:  [phone]  Physical Therapy Treatment / Discharge Summary  Patient Details  Name: Shelby Newton MRN: [id number] Date of Birth: 06-01-1945 Referring Provider (PT): Trula Slade, DPM  Progress Note  Reporting Period 12/15/2020 to 01/08/2021  See note below for Objective Data and Assessment of Progress/Goals.     Encounter Date: 01/08/2021   PT End of Session - 01/08/21 1007     Visit Number 10    Number of Visits 16    Date for PT Re-Evaluation 01/19/21    Authorization Type Medicare    PT Start Time 1007    PT Stop Time 1041    PT Time Calculation (min) 34 min    Activity Tolerance Patient tolerated treatment well    Behavior During Therapy WFL for tasks assessed/performed             Past Medical History:  Diagnosis Date   Arthritis    "ALITTLE IN HER BACK"   GERD (gastroesophageal reflux disease)    TAKES ZANTAC PRIOR TO TOMATOES   H/O seasonal allergies    Hypertension    ON MEDS 10-12 YRS NOW    Past Surgical History:  Procedure Laterality Date   ABDOMINAL HYSTERECTOMY     APPENDECTOMY     TRANSFORAMINAL LUMBAR INTERBODY FUSION (TLIF) WITH PEDICLE SCREW FIXATION 1 LEVEL N/A 08/20/2012   Procedure: TRANSFORAMINAL LUMBAR INTERBODY FUSION (TLIF) WITH PEDICLE SCREW FIXATION ONE LEVEL;  Surgeon: Ophelia Charter, MD;  Location: [facility];  Service: Neurosurgery;  Laterality: N/A;  Posteior Lumbar Four-Five Transforaminal Interbody and Fusion with Interbody Prothesis, Posterolateral Arthrodesis, and Posterior Nonsegmental Instrumentation    There were no vitals filed for this visit.   Subjective Assessment - 01/08/21 1010     Subjective Pt reports no recent pain of the kind that brought her to PT and feels like HEP is going well for her - she feels ready to transition to the HEP at this time.    How  long can you stand comfortably? back to normal    How long can you walk comfortably? back to normal    Patient Stated Goals "to be able to stand and walk w/o pain"    Currently in Pain? No/denies                [facility] PT Assessment - 01/08/21 1007       Assessment   Medical Diagnosis R ankle tendonitis & L plantar fasciitis    Referring Provider (PT) Trula Slade, DPM    Onset Date/Surgical Date --   L - March 2022; R - May 2022   Next MD Visit 02/23/21      Observation/Other Assessments   Focus on Therapeutic Outcomes (FOTO)  Foot = 67      Strength   Right Hip Flexion 4+/5    Right Hip Extension 4+/5    Right Hip External Rotation  4+/5    Right Hip Internal Rotation 5/5    Right Hip ABduction 4/5    Right Hip ADduction 4+/5    Left Hip Flexion 4+/5    Left Hip Extension 4+/5    Left Hip External Rotation 4+/5    Left Hip Internal Rotation 5/5    Left Hip ABduction 4+/5    Left Hip ADduction 4+/5    Right Knee Flexion 5/5  Right Knee Extension 5/5    Left Knee Flexion 5/5    Left Knee Extension 5/5    Right Ankle Dorsiflexion 5/5    Right Ankle Plantar Flexion 4/5   10 SLS heel raises   Right Ankle Inversion 5/5    Right Ankle Eversion 5/5    Left Ankle Dorsiflexion 5/5    Left Ankle Plantar Flexion 4/5   10 SLS heel raises   Left Ankle Inversion 5/5    Left Ankle Eversion 5/5                           [facility] Adult PT Treatment/Exercise - 01/08/21 1007       Ankle Exercises: Aerobic   Recumbent Bike L2 x 6 min      Ankle Exercises: Standing   Heel Raises Both;20 reps;Right;Left;10 reps                     PT Education - 01/08/21 1040     Education Details HEP update - negaitve heel eccentric heel raises - Access Code: [email or web address]    Person(s) Educated Patient    Methods Explanation;Demonstration;Verbal cues;Handout    Comprehension Verbalized understanding;Verbal cues required;Returned demonstration               PT Short Term Goals - 12/11/20 1112       PT SHORT TERM GOAL #1   Title Patient will be independent with initial HEP    Status Achieved   12/11/20              PT Long Term Goals - 01/08/21 1012       PT LONG TERM GOAL #1   Title Patient will be independent with ongoing/advanced HEP for self-management at home in order to build upon functional gains in therapy    Status Achieved   01/08/21     PT LONG TERM GOAL #2   Title Decrease B foot/ankle pain by >/= 50-75% allowing patient improved standing and walking tolerance    Status Achieved   12/25/20 - Pt reporting 50-60% reduciton in pain     PT LONG TERM GOAL #3   Title Patient to improve B ankle AROM to WFL without pain provocation    Status Achieved   01/06/21 - Pt reports no functional limitations from ankle AROM and no pain     PT LONG TERM GOAL #4   Title Patient will demonstrate improved B LE strength to >/= 4 to 4+/5 for improved stability and ease of mobility    Status Achieved   01/08/21     PT LONG TERM GOAL #5   Title Patient will improve standing and/or walking tolerance to >/= 30 minutes w/o pain interference to allow resumption of normal daily activities    Status Achieved   01/08/21 - Pt reports standing and walking tolerance is back to normal w/o any limitations with daily activities                  Plan - 01/08/21 1015     Clinical Impression Statement Shelby Newton has demonstrated excellent progress with PT and is very pleased with the reduction in pain and her current functional level. She reports she has been able to resume most of her normal daily activities w/o limitation due to B foot/ankle pain and notes improved activity tolerance/endurance due to improved strength. Her B ankle ROM is now WNL and overall LE strength is grossly   4+/5 to 5/5 with only exceptions being R hip abduction and B PF still 4/5. She has met all of her goals and had exceeded the predicted FOTO score of 58 with current FOTO =  67. She feels confident with her HEP and feels ready to discharge from PT at this time.    Comorbidities HTN, OA, LBP s/p L4-5 fusion 2014, GERD, allergies    Rehab Potential Good    PT Treatment/Interventions ADLs/Self Care Home Management;Cryotherapy;Electrical Stimulation;Iontophoresis 87m/ml Dexamethasone;Moist Heat;Ultrasound;Gait training;Stair training;Functional mobility training;Therapeutic activities;Therapeutic exercise;Balance training;Neuromuscular re-education;Patient/family education;Manual techniques;Passive range of motion;Dry needling;Taping;Vasopneumatic Device;Joint Manipulations    PT Next Visit Plan Transition to HEP + Discharge    PT Home Exercise Plan Access Code: [email or web address](8/16, updated 9/2, 9/7, 9/23 & 9/30)    Consulted and Agree with Plan of Care Patient             Patient will benefit from skilled therapeutic intervention in order to improve the following deficits and impairments:  Abnormal gait, Decreased activity tolerance, Decreased balance, Decreased endurance, Decreased mobility, Decreased range of motion, Decreased strength, Difficulty walking, Increased edema, Increased fascial restricitons, Increased muscle spasms, Impaired perceived functional ability, Impaired flexibility, Improper body mechanics, Postural dysfunction, Pain  Visit Diagnosis: Pain in right ankle and joints of right foot  Pain in left foot  Difficulty in walking, not elsewhere classified  Other abnormalities of gait and mobility  Muscle weakness (generalized)  Localized edema     Problem List There are no problems to display for this patient.    PHYSICAL THERAPY DISCHARGE SUMMARY  Visits from Start of Care: 10  Current functional level related to goals / functional outcomes:   Refer to above clinical impression.   Remaining deficits:   As above.   Education / Equipment:   HEP   Patient agrees to discharge. Patient goals were met. Patient is being discharged due  to meeting the stated rehab goals.   JPercival Spanish PT 01/08/2021, 10:52 AM  [facility] [place] [address]Phone: [phone]  Fax:  [phone] Name: Shelby MCGLADEMRN: [id number] of Birth: [birth year]

## 2021-02-23 ENCOUNTER — Ambulatory Visit: Payer: Medicare Other | Admitting: Podiatry

## 2022-03-23 ENCOUNTER — Other Ambulatory Visit: Payer: Self-pay | Admitting: Internal Medicine

## 2022-03-23 DIAGNOSIS — M85859 Other specified disorders of bone density and structure, unspecified thigh: Secondary | ICD-10-CM

## 2022-03-29 ENCOUNTER — Ambulatory Visit
Admission: RE | Admit: 2022-03-29 | Discharge: 2022-03-29 | Disposition: A | Discharge status: Home / Self Care | Payer: Medicare Other | Source: Ambulatory Visit | Attending: Internal Medicine | Admitting: Internal Medicine

## 2022-03-29 DIAGNOSIS — M85859 Other specified disorders of bone density and structure, unspecified thigh: Secondary | ICD-10-CM

## 2022-11-06 ENCOUNTER — Other Ambulatory Visit: Payer: Self-pay

## 2022-11-06 ENCOUNTER — Encounter (HOSPITAL_BASED_OUTPATIENT_CLINIC_OR_DEPARTMENT_OTHER): Payer: Self-pay

## 2022-11-06 ENCOUNTER — Emergency Department (HOSPITAL_BASED_OUTPATIENT_CLINIC_OR_DEPARTMENT_OTHER)
Admission: EM | Admit: 2022-11-06 | Discharge: 2022-11-06 | Disposition: A | Discharge status: Home / Self Care | Payer: PRIVATE HEALTH INSURANCE | Attending: Emergency Medicine | Admitting: Emergency Medicine

## 2022-11-06 ENCOUNTER — Emergency Department (HOSPITAL_BASED_OUTPATIENT_CLINIC_OR_DEPARTMENT_OTHER): Payer: PRIVATE HEALTH INSURANCE

## 2022-11-06 DIAGNOSIS — R739 Hyperglycemia, unspecified: Secondary | ICD-10-CM | POA: Diagnosis not present

## 2022-11-06 DIAGNOSIS — I1A0 Resistant hypertension: Secondary | ICD-10-CM | POA: Diagnosis not present

## 2022-11-06 DIAGNOSIS — R519 Headache, unspecified: Secondary | ICD-10-CM | POA: Insufficient documentation

## 2022-11-06 DIAGNOSIS — Z79899 Other long term (current) drug therapy: Secondary | ICD-10-CM | POA: Insufficient documentation

## 2022-11-06 DIAGNOSIS — R7309 Other abnormal glucose: Secondary | ICD-10-CM

## 2022-11-06 LAB — I-STAT CHEM 8, ED
BUN: 24 mg/dL — ABNORMAL HIGH (ref 8–23)
Calcium, Ion: 1.16 mmol/L (ref 1.15–1.40)
Chloride: 103 mmol/L (ref 98–111)
Creatinine, Ser: 0.9 mg/dL (ref 0.44–1.00)
Glucose, Bld: 160 mg/dL — ABNORMAL HIGH (ref 70–99)
HCT: 39 % (ref 36.0–46.0)
Hemoglobin: 13.3 g/dL (ref 12.0–15.0)
Potassium: 3.8 mmol/L (ref 3.5–5.1)
Sodium: 138 mmol/L (ref 135–145)
TCO2: 25 mmol/L (ref 22–32)

## 2022-11-06 LAB — CBC WITH DIFFERENTIAL/PLATELET
Abs Immature Granulocytes: 0.02 10*3/uL (ref 0.00–0.07)
Basophils Absolute: 0 10*3/uL (ref 0.0–0.1)
Basophils Relative: 1 %
Eosinophils Absolute: 0.3 10*3/uL (ref 0.0–0.5)
Eosinophils Relative: 4 %
HCT: 38.2 % (ref 36.0–46.0)
Hemoglobin: 12.3 g/dL (ref 12.0–15.0)
Immature Granulocytes: 0 %
Lymphocytes Relative: 30 %
Lymphs Abs: 2.3 10*3/uL (ref 0.7–4.0)
MCH: 27.7 pg (ref 26.0–34.0)
MCHC: 32.2 g/dL (ref 30.0–36.0)
MCV: 86 fL (ref 80.0–100.0)
Monocytes Absolute: 0.6 10*3/uL (ref 0.1–1.0)
Monocytes Relative: 7 %
Neutro Abs: 4.4 10*3/uL (ref 1.7–7.7)
Neutrophils Relative %: 58 %
Platelets: 276 10*3/uL (ref 150–400)
RBC: 4.44 MIL/uL (ref 3.87–5.11)
RDW: 13.4 % (ref 11.5–15.5)
WBC: 7.6 10*3/uL (ref 4.0–10.5)
nRBC: 0 % (ref 0.0–0.2)

## 2022-11-06 LAB — URINALYSIS, ROUTINE W REFLEX MICROSCOPIC
Bilirubin Urine: NEGATIVE
Glucose, UA: NEGATIVE mg/dL
Hgb urine dipstick: NEGATIVE
Ketones, ur: NEGATIVE mg/dL
Nitrite: NEGATIVE
Protein, ur: NEGATIVE mg/dL
Specific Gravity, Urine: 1.02 (ref 1.005–1.030)
pH: 5.5 (ref 5.0–8.0)

## 2022-11-06 LAB — URINALYSIS, MICROSCOPIC (REFLEX): RBC / HPF: NONE SEEN RBC/hpf (ref 0–5)

## 2022-11-06 LAB — SEDIMENTATION RATE: Sed Rate: 34 mm/hr — ABNORMAL HIGH (ref 0–22)

## 2022-11-06 MED ORDER — FENTANYL CITRATE PF 50 MCG/ML IJ SOSY
50.0000 ug | PREFILLED_SYRINGE | Freq: Once | INTRAMUSCULAR | Status: AC
  Administered 2022-11-06: 50 ug via INTRAVENOUS
  Filled 2022-11-06: qty 1

## 2022-11-06 MED ORDER — DIPHENHYDRAMINE HCL 50 MG/ML IJ SOLN
12.5000 mg | Freq: Once | INTRAMUSCULAR | Status: AC
  Administered 2022-11-06: 12.5 mg via INTRAVENOUS
  Filled 2022-11-06: qty 1

## 2022-11-06 MED ORDER — METOCLOPRAMIDE HCL 5 MG/ML IJ SOLN
10.0000 mg | Freq: Once | INTRAMUSCULAR | Status: AC
  Administered 2022-11-06: 10 mg via INTRAVENOUS
  Filled 2022-11-06: qty 2

## 2022-11-06 NOTE — ED Provider Notes (Signed)
[place] [facility] Provider Note   CSN: [id number] Arrival date & time: 11/06/22  9528     History  Chief Complaint  Patient presents with   Headache    Shelby Newton is a 77 y.o. female.  She is brought in by her husband for evaluation of a headache has been going on for [redacted] weeks along with elevated blood pressure.  She said her doctor took her off of one of her blood pressure medications about a month ago for low blood pressure and since then her blood pressure has been rising.  She has had ongoing generalized headache for 3 weeks.  She has messaged her doctor about it but has not back.  No blurry vision double vision, SOB, chest pain, numbness, weakness.   The history is provided by the patient.  Headache Pain location:  Generalized Quality:  Dull Severity currently:  7/10 Severity at highest:  7/10 Onset quality:  Gradual Duration:  3 weeks Timing:  Constant Progression:  Unchanged Chronicity:  New Relieved by:  Nothing Worsened by:  Nothing Ineffective treatments:  None tried Associated symptoms: no abdominal pain, no blurred vision, no cough, no fever, no focal weakness, no loss of balance, no nausea, no neck pain, no paresthesias and no vomiting        Home Medications Prior to Admission medications   Medication Sig Start Date End Date Taking? Authorizing Provider  amLODipine (NORVASC) 5 MG tablet  08/19/17   [provider]  amLODipine (NORVASC) 5 MG tablet Take 1 tablet by mouth daily.    [provider]  cholecalciferol (VITAMIN D) 1000 UNITS tablet Take 1,000 Units by mouth daily. Patient not taking: Reported on 11/24/2020    [provider]  cholecalciferol (VITAMIN D3) 25 MCG (1000 UNIT) tablet 1 tablet    [provider]  ciprofloxacin (CIPRO) 500 MG tablet Take 500 mg by mouth 2 (two) times daily. Patient not taking: Reported on 11/24/2020 10/20/20   [provider]   hydrochlorothiazide (HYDRODIURIL) 12.5 MG tablet 1 tablet in the morning 07/02/19   [provider]  meloxicam (MOBIC) 7.5 MG tablet Take 1 tablet (7.5 mg total) by mouth daily. Patient not taking: Reported on 11/24/2020 09/24/20   Vivi Barrack, DPM  valsartan (DIOVAN) 320 MG tablet Take 320 mg by mouth daily. 03/17/20   [provider]  valsartan-hydrochlorothiazide (DIOVAN-HCT) 320-12.5 MG per tablet Take 1 tablet by mouth daily. Patient not taking: Reported on 11/24/2020    [provider]      Allergies    Sulfa antibiotics, Macrodantin [nitrofurantoin macrocrystal], Vicodin [hydrocodone-acetaminophen], and Yellow dyes (non-tartrazine)    Review of Systems   Review of Systems  Constitutional:  Negative for fever.  Eyes:  Negative for blurred vision and visual disturbance.  Respiratory:  Negative for cough.   Cardiovascular:  Negative for chest pain.  Gastrointestinal:  Negative for abdominal pain, nausea and vomiting.  Musculoskeletal:  Negative for neck pain.  Neurological:  Positive for headaches. Negative for focal weakness, paresthesias and loss of balance.    Physical Exam Updated Vital Signs BP (!) 213/69   Pulse 61   Temp 97.6 F (36.4 C) (Oral)   Resp 18   Ht 5\' 3"  (1.6 m)   Wt 83 kg   SpO2 96%   BMI 32.41 kg/m  Physical Exam Vitals and nursing note reviewed.  Constitutional:      General: She is not in acute distress.  Appearance: She is well-developed.  HENT:     Head: Normocephalic and atraumatic.  Eyes:     Conjunctiva/sclera: Conjunctivae normal.  Cardiovascular:     Rate and Rhythm: Normal rate and regular rhythm.     Heart sounds: No murmur heard. Pulmonary:     Effort: Pulmonary effort is normal. No respiratory distress.     Breath sounds: Normal breath sounds.  Abdominal:     Palpations: Abdomen is soft.     Tenderness: There is no abdominal tenderness.  Musculoskeletal:        General: No swelling.      Cervical back: Neck supple.  Skin:    General: Skin is warm and dry.     Capillary Refill: Capillary refill takes less than 2 seconds.  Neurological:     Mental Status: She is alert.     Cranial Nerves: No cranial nerve deficit or dysarthria.     Sensory: No sensory deficit.     Motor: No weakness.     Gait: Gait normal.     ED Results / Procedures / Treatments   Labs (all labs ordered are listed, but only abnormal results are displayed) Labs Reviewed  URINALYSIS, ROUTINE W REFLEX MICROSCOPIC - Abnormal; Notable for the following components:      Result Value   Leukocytes,Ua TRACE (*)    All other components within normal limits  SEDIMENTATION RATE - Abnormal; Notable for the following components:   Sed Rate 34 (*)    All other components within normal limits  URINALYSIS, MICROSCOPIC (REFLEX) - Abnormal; Notable for the following components:   Bacteria, UA FEW (*)    All other components within normal limits  I-STAT CHEM 8, ED - Abnormal; Notable for the following components:   BUN 24 (*)    Glucose, Bld 160 (*)    All other components within normal limits  CBC WITH DIFFERENTIAL/PLATELET    EKG EKG Interpretation Date/Time:  Sunday November 06 2022 10:07:21 EDT Ventricular Rate:  60 PR Interval:  173 QRS Duration:  91 QT Interval:  397 QTC Calculation: 397 R Axis:   49  Text Interpretation: Sinus rhythm no acture ST,Ts No significant change since prior 5/14 Confirmed by Meridee Score [phone]) on 11/06/2022 10:09:02 AM  Radiology CT Head Wo Contrast  Result Date: 11/06/2022 CLINICAL DATA:  Provided history: Headache, new onset. EXAM: CT HEAD WITHOUT CONTRAST TECHNIQUE: Contiguous axial images were obtained from the base of the skull through the vertex without intravenous contrast. RADIATION DOSE REDUCTION: This exam was performed according to the departmental dose-optimization program which includes automated exposure control, adjustment of the mA and/or kV according to  patient size and/or use of iterative reconstruction technique. COMPARISON:  Head CT 11/02/2009. FINDINGS: Brain: No age advanced or lobar predominant parenchymal atrophy. Focal asymmetric prominence of the extra-axial CSF space overlying the high right frontal and parietal lobes, measuring 4.3 x 2.4 cm. Findings are consistent with the presence of an arachnoid cyst at this site (series 4, image 30). Chronic lacunar infarct within the right basal ganglia, new from the prior head CT of 11/02/2009. 14 mm partially calcified extra-axial dural-based mass along the left aspect of the posterior falx (overlying the left parietal lobe) consistent with a meningioma. This has not significantly changed in size from the prior head CT (for instance as seen on series 4, image 17) (series 2, image 23). No significant mass effect upon the underlying brain parenchyma. Expanded and partially empty sella turcica. There is no acute  intracranial hemorrhage. No demarcated cortical infarct No midline shift. Vascular: No hyperdense vessel.  Atherosclerotic calcifications. Skull: No calvarial fracture or aggressive osseous lesion. Sinuses/Orbits: No mass or acute finding within the imaged orbits. Minimal mucosal thickening within the right maxillary sinus. Minimal mucosal thickening and small mucous retention cyst within the left maxillary sinus. Minimal mucosal thickening or fluid within the inferior right frontal sinus. IMPRESSION: 1. No evidence of an acute intracranial abnormality. 2. Chronic lacunar infarct within the right basal ganglia, new from the prior head CT of 11/02/2009. 3. 14 mm partially calcified meningioma along the left aspect of the posterior falx (overlying the left parietal lobe), unchanged in size. No significant mass effect upon the underlying brain parenchyma. 4. 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 5. Minor paranasal sinus disease as described. Electronically  Signed   By: Jackey Loge D.O.   On: 11/06/2022 11:18    Procedures Procedures    Medications Ordered in ED Medications  fentaNYL (SUBLIMAZE) injection 50 mcg (50 mcg Intravenous Given 11/06/22 1029)  metoCLOPramide (REGLAN) injection 10 mg (10 mg Intravenous Given 11/06/22 1027)  diphenhydrAMINE (BENADRYL) injection 12.5 mg (12.5 mg Intravenous Given 11/06/22 1026)    ED Course/ Medical Decision Making/ A&P Clinical Course as of 11/06/22 1740  Sun Nov 06, 2022  1039 Patient apparently became very sleepy and desaturated.  Given medications.  Nurse placed her on some oxygen.  Will continue to monitor. [MB]  1121 Patient's head CT does not show any acute findings although she does have multiple old findings. [MB]  1232 Patient states her headache is much improved and her blood pressures come down a little bit.  I reviewed the results of the CT with her along with her newly elevated glucose.  Recommended close follow-up with her PCP regarding these. [MB]    Clinical Course User Index [MB] Terrilee Files, MD                             Medical Decision Making Amount and/or Complexity of Data Reviewed Labs: ordered. Radiology: ordered.  Risk Prescription drug management.   This patient complains of headache and elevated blood pressure; this involves an extensive number of treatment Options and is a complaint that carries with it a high risk of complications and morbidity. The differential includes hypertension, hypertensive urgency/emergency, stroke, bleed, metabolic derangement, temporal arteritis  I ordered, reviewed and interpreted labs, which included CBC normal chemistries normal other than elevated glucose, sed rate elevated although not critically so, urinalysis without signs of infection I ordered medication IV Reglan and fentanyl for pain, Benadryl and reviewed PMP when indicated. I ordered imaging studies which included CT head and I independently    visualized and  interpreted imaging which showed no acute findings, does have meningioma and signs of stroke old Additional history obtained from patient's husband Previous records obtained and reviewed in epic including recent PCP notes  Cardiac monitoring reviewed, sinus bradycardia Social determinants considered, no significant barriers Critical Interventions: None  After the interventions stated above, I reevaluated the patient and found patient's headache to be markedly improved.  Blood pressure still elevated although trending down. Admission and further testing considered, recommended close follow-up with PCP for further management of her blood pressure and continued management of her symptoms.  Return instructions discussed.         Final Clinical Impression(s) / ED Diagnoses Final diagnoses:  Generalized headache  Resistant hypertension  Elevated glucose    Rx / DC Orders ED Discharge Orders     None         Terrilee Files, MD 11/06/22 1743

## 2022-11-06 NOTE — ED Notes (Signed)
Patients sp02 88% on room air after pain medication. Places on 3lpm of o2 and now 95%.

## 2022-11-06 NOTE — ED Triage Notes (Signed)
Patient has some changes to blood pressure medications in June due to hypotension. She was feeling better. Then over the last three weeks she has had frequent headaches and her blood pressure was been going up and down.

## 2022-11-06 NOTE — Discharge Instructions (Signed)
You were seen in the emergency department for evaluation of 3 weeks of headache along with elevated blood pressures.  You had a CAT scan and blood work that did not show any obvious patient for your symptoms.  There was some findings on the CAT scan that appeared stable but will need follow-up with your doctor including signs of an old stroke and meningioma.  Your blood sugar was also elevated here and this will need to be watched.  Please continue regular medications and record your blood pressures for your doctor and follow-up with soon.  Return to the emergency department if any worsening or concerning symptoms.

## 2023-07-04 NOTE — Progress Notes (Unsigned)
 New Patient Evaluation and Consultation  Referring Provider: Steva Ready, DO PCP: Thana Ates, MD Date of Service: 07/05/2023  SUBJECTIVE Chief Complaint: New Patient (Initial Visit) (Shelby Newton is a 78 y.o. female here today for cystocele.)  History of Present Illness: Shelby Newton is a 78 y.o. White or Caucasian female seen in consultation at the request of Dr Connye Burkitt for evaluation of stage III pelvic organ prolapse.    Reports history of "bladder tack" by Dr. Patsi Sears for asymptomatic bladder bulge in [place], [place] at the time of TAH, BSO for ovarian cysts for by Dr. Susann Givens when she was 78yo. Unknown procedure with or without mesh. Reports vaginal bulge noted at the vaginal opening when she is wiping or bathing with some discomfort during intercourse, symptoms started around 10 years with around 33lb weight gain. Retired 12 years ago with R hip and knee pain Reports "allergic reaction" with burning and irritation on day 3 of use. Avoids intercourse due to prolapse since 10/2022, worsened since weight gain Reports history of recurrent UTI managed by cefpodoxime daily 3-4 years ago Multiple allergies to sulfa, macrobid, concerns with fluoroquinolone and tendinopathy Suspected kidney stones in 2015 denies intervention and UTIs managed by Dr. Alvester Morin, last seen 2-3 years ago Reports history of urethral stretching for bladder pain syndrome.  History of L4-5 decompression and fusion by Dr. Lovell Sheehan in 2014, complicated by exploration of lumbar wound with repair of pseudomeningocele with headaches.  Last attended PT for RLE in 2024-2025.  H/o TAH, BSO in 1982 for ovarian cysts  Urgency urinary leakage started 5 years ago Denies treatments in the past.   Review of records significant for: venous stasis   Urinary Symptoms: Leaks urine with cough/ sneeze, lifting, and with movement to the bathroom Leaks 2 time(s) per days with urgency leakage, more bothersome Leaks  3x/week with coughing/sneezing, worsened by allergies Pad use: 2 liners/ mini-pads per day.   Patient is bothered by UI symptoms.  Day time voids 4.  Nocturia: 1 times per night to void. Voiding dysfunction:  empties bladder well.  Patient does not use a catheter to empty bladder.  When urinating, patient feels to push on her belly or vagina to empty bladder Drinks: 48oz water per day, 8oz of coca cola  UTIs: 3 UTI's in the last year.   Denies history of blood in urine, kidney or bladder stones, pyelonephritis, bladder cancer, and kidney cancer Suspected kidney stone No results found for the last 90 days.   Pelvic Organ Prolapse Symptoms:                  Patient Admits to a feeling of a bulge the vaginal area. It has been present for 10 days.  Patient Denies seeing a bulge.  This bulge is bothersome.  Bowel Symptom: Bowel movements: 1 time(s) per day Stool consistency: hard or soft , [place] I and VI-VII Straining: no.  Splinting: no.  Incomplete evacuation: no.  Patient Denies accidental bowel leakage / fecal incontinence Bowel regimen: none Last colonoscopy: reports negative cologuard in 2024, no report for review [facility] Colonoscopy   This patient has no relevant Health Maintenance data.     Sexual Function Sexually active: yes.  Sexual orientation: Straight Pain with sex: Yes, has discomfort due to prolapse, has discomfort due to dryness some relief with lubrication and position changes  Pelvic Pain Admits to pelvic pain Location: right sided  Pain occurs: when she goes from sitting to standing in her right groin  Prior pain treatment: PT Improved by: when she gets up to move Worsened by: getting out of a car with abduction of leg and hip   Past Medical History:  Past Medical History:  Diagnosis Date   Arthritis    "ALITTLE IN HER BACK"   GERD (gastroesophageal reflux disease)    TAKES ZANTAC PRIOR TO TOMATOES   H/O seasonal allergies    Hypertension    ON  MEDS 10-12 YRS NOW     Past Surgical History:   Past Surgical History:  Procedure Laterality Date   ABDOMINAL HYSTERECTOMY     APPENDECTOMY     bladder     bladder tack   ovary cyst Right    TRANSFORAMINAL LUMBAR INTERBODY FUSION (TLIF) WITH PEDICLE SCREW FIXATION 1 LEVEL N/A 08/20/2012   Procedure: TRANSFORAMINAL LUMBAR INTERBODY FUSION (TLIF) WITH PEDICLE SCREW FIXATION ONE LEVEL;  Surgeon: Cristi Loron, MD;  Location: [facility];  Service: Neurosurgery;  Laterality: N/A;  Posteior Lumbar Four-Five Transforaminal Interbody and Fusion with Interbody Prothesis, Posterolateral Arthrodesis, and Posterior Nonsegmental Instrumentation     Past OB/GYN History: OB History  Gravida Para Term Preterm AB Living  2 2 2   2   SAB IAB Ectopic Multiple Live Births      2    # Outcome Date GA Lbr Len/2nd Weight Sex Type Anes PTL Lv  2 Term     M Vag-Spont   LIV     Complications: Other Excessive Bleeding  1 Term     M Vag-Spont   LIV   Vaginal deliveries: 2, largest infant 8lb 3oz Forceps/ Vacuum deliveries: 0, Cesarean section: 0 Menopausal: Yes, at age 78, Denies vaginal bleeding since menopause Contraception: s/p menopause. Last pap smear was prior to hysterectomy.  Any history of abnormal pap smears: no. No results found for: "DIAGPAP", "HPVHIGH", "ADEQPAP"  Medications: Patient has a current medication list which includes the following prescription(s): amlodipine, cholecalciferol, [START ON 07/06/2023] estradiol, omega-3 fatty acids, valsartan-hydrochlorothiazide, gemtesa, and gemtesa.   Allergies: Patient is allergic to sulfa antibiotics, macrodantin [nitrofurantoin macrocrystal], vicodin [hydrocodone-acetaminophen], and yellow dyes (non-tartrazine).   Social History:  Social History   Tobacco Use   Smoking status: Never   Smokeless tobacco: Never  Vaping Use   Vaping status: Never Used  Substance Use Topics   Alcohol use: No    Comment: RARE GLASS OF WINE   Drug use:  No    Relationship status: married Patient lives with her husband and son.   Patient is not employed. Regular exercise: No History of abuse: No  Family History:   Family History  Problem Relation Age of Onset   Cancer Mother        mets, unkown source   Diabetes Father    Breast cancer Sister        mets   Diabetes Brother    Diabetes Son    Cancer Son        neck   Bladder Cancer Neg Hx      Review of Systems: Review of Systems  Constitutional:  Negative for fever, malaise/fatigue and weight loss.  Respiratory:  Negative for cough, shortness of breath and wheezing.   Cardiovascular:  Negative for chest pain, palpitations and leg swelling.  Gastrointestinal:  Positive for constipation. Negative for abdominal pain and blood in stool.  Genitourinary:  Negative for dysuria, frequency, hematuria and urgency.       Bulge  Musculoskeletal:  Positive for joint pain.  Skin:  Negative  for rash.  Neurological:  Negative for dizziness, weakness and headaches.  Endo/Heme/Allergies:  Does not bruise/bleed easily.  Psychiatric/Behavioral:  Negative for depression. The patient is not nervous/anxious.      OBJECTIVE Physical Exam: Vitals:   07/05/23 0806 07/05/23 0922  BP: (!) 146/79 138/79  Pulse: 67 69  Weight: 203 lb (92.1 kg)   Height: 5\' 3"  (1.6 m)    Physical Exam Constitutional:      General: She is not in acute distress.    Appearance: Normal appearance.  Genitourinary:     Bladder and urethral meatus normal.     No lesions in the vagina.     Right Labia: No rash, tenderness, lesions, skin changes or Bartholin's cyst.    Left Labia: No tenderness, lesions, skin changes, Bartholin's cyst or rash.    No vaginal discharge, erythema, tenderness, bleeding, ulceration or granulation tissue.     Posterior vaginal prolapse present.    Moderate vaginal atrophy present.     Right Adnexa: not tender, not full and no mass present.    Left Adnexa: not tender, not full and no  mass present.    Cervix is absent.     Uterus is absent.     Urethral meatus caruncle not present.    No urethral prolapse, tenderness, mass, hypermobility, discharge or stress urinary incontinence with cough stress test present.     Bladder is not tender, urgency on palpation not present and masses not present.      Pelvic Floor: Levator muscle strength is 2/5.    Levator ani is tender (bilaterally, R > L) and obturator internus is tender.     No asymmetrical contractions present and no pelvic spasms present.    Symmetrical pelvic sensation, anal wink present and BC reflex present. Cardiovascular:     Rate and Rhythm: Normal rate.  Pulmonary:     Effort: Pulmonary effort is normal. No respiratory distress.  Abdominal:     General: There is no distension.     Palpations: Abdomen is soft. There is no mass.     Tenderness: There is abdominal tenderness.     Hernia: No hernia is present.    Musculoskeletal:       Legs:  Neurological:     Mental Status: She is alert.  Vitals reviewed. Exam conducted with a chaperone present.      POP-Q:   POP-Q  -2                                            Aa   -2                                           Ba  -6                                              C   3                                            Gh  3                                            Pb  7                                            tvl   -1                                            Ap  -1                                            Bp                                                 D      Post-Void Residual (PVR) by Bladder Scan: In order to evaluate bladder emptying, we discussed obtaining a postvoid residual and patient agreed to this procedure.  Procedure: The ultrasound unit was placed on the patient's abdomen in the suprapubic region after the patient had voided.    Post Void Residual - 07/05/23 0815       Post Void Residual   Post Void Residual  68 mL              Laboratory Results: Lab Results  Component Value Date   COLORU Yellow 07/05/2023   CLARITYU Clear 07/05/2023   GLUCOSEUR Negative 07/05/2023   BILIRUBINUR Negative 07/05/2023   KETONESU Negative 07/05/2023   SPECGRAV 1.025 07/05/2023   RBCUR Negative 07/05/2023   PHUR 5.5 07/05/2023   PROTEINUR Positive (A) 07/05/2023   UROBILINOGEN 0.2 07/05/2023   LEUKOCYTESUR Negative 07/05/2023    Lab Results  Component Value Date   CREATININE 0.90 11/06/2022   CREATININE 0.85 09/25/2012   CREATININE 0.69 08/21/2012    No results found for: "HGBA1C"  Lab Results  Component Value Date   HGB 13.3 11/06/2022     ASSESSMENT AND PLAN Ms. Bellucci is a 78 y.o. with:  1. Mixed stress and urge urinary incontinence   2. History of pelvic surgery   3. History of recurrent UTI (urinary tract infection)   4. Pelvic pain   5. Pelvic organ prolapse quantification stage 2 rectocele   6. BMI 35.0-35.9,adult   7. Constipation, unspecified constipation type     Mixed stress and urge urinary incontinence Assessment & Plan: - POCT UA + Protein, PVR 68mL - We discussed the symptoms of overactive bladder (OAB), which include urinary urgency, urinary frequency, nocturia, with or without urge incontinence.  While we do not know the exact etiology of OAB, several treatment options exist. We discussed management including behavioral therapy (decreasing bladder irritants, urge suppression strategies, timed voids, bladder retraining), physical therapy, medication; for refractory cases posterior tibial nerve stimulation, sacral neuromodulation, and intravesical botulinum toxin injection.  For anticholinergic medications, we discussed the potential side effects of anticholinergics including dry eyes, dry  mouth, constipation, cognitive impairment and urinary retention. For Beta-3 agonist medication, we discussed the potential side effect of elevated blood pressure which is more likely  to occur in individuals with uncontrolled hypertension. - samples and Rx for trial of Gemtesa - start miralax for constipation - For treatment of stress urinary incontinence,  non-surgical options include expectant management, weight loss, physical therapy, as well as a pessary.  Surgical options include a midurethral sling, Burch urethropexy, and transurethral injection of a bulking agent. - start low dose vaginal estrogen - referral for pelvic floor PT  Orders: -     POCT urinalysis dipstick -     Gemtesa; Take 1 tablet (75 mg total) by mouth daily. Leslye Peer; Take 1 tablet (75 mg total) by mouth daily.  Dispense: 30 tablet; Refill: 2 -     Estradiol; Place 1g nightly for two weeks then twice a week after  Dispense: 30 g; Refill: 3 -     AMB referral to rehabilitation  History of pelvic surgery Assessment & Plan: - history of "bladder tack" in 1982 in [place] - will need urodynamics prior to surgical intervention - PVR 68mL WNL   History of recurrent UTI (urinary tract infection) Assessment & Plan: - POCT UA + protein only - PVR 68mL - For treatment of recurrent urinary tract infections, we discussed management of recurrent UTIs including prophylaxis with a daily low dose antibiotic, transvaginal estrogen therapy, D-mannose, and cranberry supplements.  We discussed the role of diagnostic testing such as cystoscopy and upper tract imaging if refractory symptoms - start vaginal estrogen - prior use of cefpodoxime daily suppression 3-4 years ago and evaluated by urology - history of urethral dilation for bladder pain syndrome   Pelvic pain Assessment & Plan: - bilateral SI Joint pain and pelvic girdle pain, improved by stabilization of ASIS - pelvic floor myofascial pain bilaterally - encouraged pelvic binder - referral to pelvic floor PT - encouraged to sleep on her side with pillow between her legs to maintain hips in neutral position - encouraged weight reduction - The  origin of pelvic floor muscle spasm can be multifactorial, including primary, reactive to a different pain source, trauma, or even part of a centralized pain syndrome.Treatment options include pelvic floor physical therapy, local (vaginal) or oral  muscle relaxants, pelvic muscle trigger point injections or centrally acting pain medications.   - encouraged pelvic floor relaxation and optimization of stool consistency    Pelvic organ prolapse quantification stage 2 rectocele Assessment & Plan: - history of "bladder tack" by Dr. Patsi Sears for asymptomatic bladder bulge in [place], [place] in 1982 - For treatment of pelvic organ prolapse, we discussed options for management including expectant management, conservative management, and surgical management, such as Kegels, a pessary, pelvic floor physical therapy, and specific surgical procedures. - pt desires to avoid surgical intervention - referral to pelvic floor PT - optimize stool consistency   BMI 35.0-35.9,adult Assessment & Plan: - encouraged pelvic binder due to mobility limitations from back and hip pain  - pt reports distress from inability to exercises due to pain - encouraged diet modification and water exercises for weight reduction - encouraged pt to review medication options for weight loss with PCP   Constipation, unspecified constipation type Assessment & Plan: - For constipation, we reviewed the importance of a better bowel regimen.  We also discussed the importance of avoiding chronic straining, as it can exacerbate her pelvic floor symptoms; we discussed treating constipation and straining  prior to surgery, as postoperative straining can lead to damage to the repair and recurrence of symptoms. We discussed initiating therapy with increasing fluid intake, fiber supplementation, stool softeners, and laxatives such as miralax.  - samples of Miralax to start - encouraged squatting position for bowel movements   Time spent: I  spent 65 minutes dedicated to the care of this patient on the date of this encounter to include pre-visit review of records, face-to-face time with the patient discussing stage II pelvic organ prolapse, mixed urinary incontinence, recurrent UTI, pelvic pain, constipation, BMI, history of pelvic surgery, and post visit documentation and ordering medication/ testing.    Loleta Chance, MD

## 2023-07-05 ENCOUNTER — Encounter: Payer: Self-pay | Admitting: Obstetrics

## 2023-07-05 ENCOUNTER — Ambulatory Visit (INDEPENDENT_AMBULATORY_CARE_PROVIDER_SITE_OTHER): Admitting: Obstetrics

## 2023-07-05 VITALS — BP 138/79 | HR 69 | Ht 63.0 in | Wt 203.0 lb

## 2023-07-05 DIAGNOSIS — R102 Pelvic and perineal pain: Secondary | ICD-10-CM | POA: Insufficient documentation

## 2023-07-05 DIAGNOSIS — N816 Rectocele: Secondary | ICD-10-CM | POA: Diagnosis not present

## 2023-07-05 DIAGNOSIS — N3946 Mixed incontinence: Secondary | ICD-10-CM | POA: Diagnosis not present

## 2023-07-05 DIAGNOSIS — Z6835 Body mass index (BMI) 35.0-35.9, adult: Secondary | ICD-10-CM | POA: Insufficient documentation

## 2023-07-05 DIAGNOSIS — K5900 Constipation, unspecified: Secondary | ICD-10-CM | POA: Diagnosis not present

## 2023-07-05 DIAGNOSIS — Z87440 Personal history of urinary (tract) infections: Secondary | ICD-10-CM | POA: Insufficient documentation

## 2023-07-05 DIAGNOSIS — Z9889 Other specified postprocedural states: Secondary | ICD-10-CM | POA: Insufficient documentation

## 2023-07-05 LAB — POCT URINALYSIS DIPSTICK
Bilirubin, UA: NEGATIVE
Blood, UA: NEGATIVE
Glucose, UA: NEGATIVE
Ketones, UA: NEGATIVE
Leukocytes, UA: NEGATIVE
Nitrite, UA: NEGATIVE
Protein, UA: POSITIVE — AB
Spec Grav, UA: 1.025 (ref 1.010–1.025)
Urobilinogen, UA: 0.2 U/dL
pH, UA: 5.5 (ref 5.0–8.0)

## 2023-07-05 MED ORDER — ESTRADIOL 0.1 MG/GM VA CREA
TOPICAL_CREAM | VAGINAL | 3 refills | Status: DC
Start: 2023-07-06 — End: 2023-10-04

## 2023-07-05 MED ORDER — GEMTESA 75 MG PO TABS
75.0000 mg | ORAL_TABLET | Freq: Every day | ORAL | 2 refills | Status: DC
Start: 2023-07-05 — End: 2023-10-04

## 2023-07-05 MED ORDER — GEMTESA 75 MG PO TABS
75.0000 mg | ORAL_TABLET | Freq: Every day | ORAL | Status: DC
Start: 2023-07-05 — End: 2023-10-04

## 2023-07-05 NOTE — Assessment & Plan Note (Signed)
-   For constipation, we reviewed the importance of a better bowel regimen.  We also discussed the importance of avoiding chronic straining, as it can exacerbate her pelvic floor symptoms; we discussed treating constipation and straining prior to surgery, as postoperative straining can lead to damage to the repair and recurrence of symptoms. We discussed initiating therapy with increasing fluid intake, fiber supplementation, stool softeners, and laxatives such as miralax.  - samples of Miralax to start - encouraged squatting position for bowel movements

## 2023-07-05 NOTE — Assessment & Plan Note (Signed)
-   POCT UA + Protein, PVR 68mL - We discussed the symptoms of overactive bladder (OAB), which include urinary urgency, urinary frequency, nocturia, with or without urge incontinence.  While we do not know the exact etiology of OAB, several treatment options exist. We discussed management including behavioral therapy (decreasing bladder irritants, urge suppression strategies, timed voids, bladder retraining), physical therapy, medication; for refractory cases posterior tibial nerve stimulation, sacral neuromodulation, and intravesical botulinum toxin injection.  For anticholinergic medications, we discussed the potential side effects of anticholinergics including dry eyes, dry mouth, constipation, cognitive impairment and urinary retention. For Beta-3 agonist medication, we discussed the potential side effect of elevated blood pressure which is more likely to occur in individuals with uncontrolled hypertension. - samples and Rx for trial of Gemtesa - start miralax for constipation - For treatment of stress urinary incontinence,  non-surgical options include expectant management, weight loss, physical therapy, as well as a pessary.  Surgical options include a midurethral sling, Burch urethropexy, and transurethral injection of a bulking agent. - start low dose vaginal estrogen - referral for pelvic floor PT

## 2023-07-05 NOTE — Assessment & Plan Note (Addendum)
-   POCT UA + protein only - PVR 68mL - For treatment of recurrent urinary tract infections, we discussed management of recurrent UTIs including prophylaxis with a daily low dose antibiotic, transvaginal estrogen therapy, D-mannose, and cranberry supplements.  We discussed the role of diagnostic testing such as cystoscopy and upper tract imaging if refractory symptoms - start vaginal estrogen - prior use of cefpodoxime daily suppression 3-4 years ago and evaluated by urology - history of urethral dilation for bladder pain syndrome

## 2023-07-05 NOTE — Assessment & Plan Note (Addendum)
-   encouraged pelvic binder due to mobility limitations from back and hip pain  - pt reports distress from inability to exercises due to pain - encouraged diet modification and water exercises for weight reduction - encouraged pt to review medication options for weight loss with PCP

## 2023-07-05 NOTE — Assessment & Plan Note (Addendum)
-   history of "bladder tack" by Dr. Patsi Sears for asymptomatic bladder bulge in [place], [place] in 1982 - For treatment of pelvic organ prolapse, we discussed options for management including expectant management, conservative management, and surgical management, such as Kegels, a pessary, pelvic floor physical therapy, and specific surgical procedures. - pt desires to avoid surgical intervention - referral to pelvic floor PT - optimize stool consistency

## 2023-07-05 NOTE — Assessment & Plan Note (Signed)
-   history of "bladder tack" in 1982 in [place] - will need urodynamics prior to surgical intervention - PVR 68mL WNL

## 2023-07-05 NOTE — Patient Instructions (Addendum)
 We discussed the symptoms of overactive bladder (OAB), which include urinary urgency, urinary frequency, night-time urination, with or without urge incontinence.  We discussed management including behavioral therapy (decreasing bladder irritants by following a bladder diet, urge suppression strategies, timed voids, bladder retraining), physical therapy, medication; and for refractory cases posterior tibial nerve stimulation, sacral neuromodulation, and intravesical botulinum toxin injection.   For Beta-3 agonist medication, we discussed the potential side effect of elevated blood pressure which is more likely to occur in individuals with uncontrolled hypertension. You were given samples for Gemtesa 75 mg.  It can take a month to start working so give it time, but if you have bothersome side effects call sooner and we can try a different medication.  Call us if you have trouble filling the prescription or if it's not covered by your insurance.  For treatment of stress urinary incontinence, which is leakage with physical activity/movement/strainging/coughing, we discussed expectant management versus nonsurgical options versus surgery. Nonsurgical options include weight loss, physical therapy, as well as a pessary.  Surgical options include a midurethral sling, which is a synthetic mesh sling that acts like a hammock under the urethra to prevent leakage of urine, a Burch urethropexy, and transurethral injection of a bulking agent.   For treatment of recurrent urinary tract infections, we discussed management of recurrent UTIs including prophylaxis with a daily low dose antibiotic, transvaginal estrogen therapy, D-mannose, and cranberry supplements.  We discussed the role of diagnostic testing such as cystoscopy and upper tract imaging.     For vaginal atrophy (thinning of the vaginal tissue that can cause dryness and burning) and UTI prevention we discussed estrogen replacement in the form of vaginal cream.    Start vaginal estrogen therapy nightly for two weeks then 2 times weekly at night. This can be placed with your finger or an applicator inside the vagina and around the urethra.  Please let us know if the prescription is too expensive and we can look for alternative options.   Is vaginal estrogen therapy safe for me? Vaginal estrogen preparations act on the vaginal skin, and only a very tiny amount is absorbed into the bloodstream (0.01%).  They work in a similar way to hand or face cream.  There is minimal absorption and they are therefore perfectly safe. If you have had breast cancer and have persistent troublesome symptoms which aren't settling with vaginal moisturisers and lubricants, local estrogen treatment may be a possibility, but consultation with your oncologist should take place first.   Constipation: Our goal is to achieve formed bowel movements daily or every-other-day.  You may need to try different combinations of the following options to find what works best for you - everybody's body works differently so feel free to adjust the dosages as needed.  Some options to help maintain bowel health include:  Dietary changes (more leafy greens, vegetables and fruits; less processed foods) Fiber supplementation (Benefiber, FiberCon, Metamucil or Psyllium). Start slow and increase gradually to full dose. Over-the-counter agents such as: stool softeners (Docusate or Colace) and/or laxatives (Miralax, milk of magnesia)  "Power Pudding" is a natural mixture that may help your constipation.  To make blend 1 cup applesauce, 1 cup wheat bran, and 3/4 cup prune juice, refrigerate and then take 1 tablespoon daily with a large glass of water as needed.   Women should try to eat at least 21 to 25 grams of fiber a day, while men should aim for 30 to 38 grams a day. You can add  fiber to your diet with food or a fiber supplement such as psyllium (metamucil), benefiber, or fibercon.   Here's a look at how  much dietary fiber is found in some common foods. When buying packaged foods, check the Nutrition Facts label for fiber content. It can vary among brands.  Fruits Serving size Total fiber (grams)*  Raspberries 1 cup 8.0  Pear 1 medium 5.5  Apple, with skin 1 medium 4.5  Banana 1 medium 3.0  Orange 1 medium 3.0  Strawberries 1 cup 3.0   Vegetables Serving size Total fiber (grams)*  Green peas, boiled 1 cup 9.0  Broccoli, boiled 1 cup chopped 5.0  Turnip greens, boiled 1 cup 5.0  Brussels sprouts, boiled 1 cup 4.0  Potato, with skin, baked 1 medium 4.0  Sweet corn, boiled 1 cup 3.5  Cauliflower, raw 1 cup chopped 2.0  Carrot, raw 1 medium 1.5   Grains Serving size Total fiber (grams)*  Spaghetti, whole-wheat, cooked 1 cup 6.0  Barley, pearled, cooked 1 cup 6.0  Bran flakes 3/4 cup 5.5  Quinoa, cooked 1 cup 5.0  Oat bran muffin 1 medium 5.0  Oatmeal, instant, cooked 1 cup 5.0  Popcorn, air-popped 3 cups 3.5  Brown rice, cooked 1 cup 3.5  Bread, whole-wheat 1 slice 2.0  Bread, rye 1 slice 2.0   Legumes, nuts and seeds Serving size Total fiber (grams)*  Split peas, boiled 1 cup 16.0  Lentils, boiled 1 cup 15.5  Black beans, boiled 1 cup 15.0  Baked beans, canned 1 cup 10.0  Chia seeds 1 ounce 10.0  Almonds 1 ounce (23 nuts) 3.5  Pistachios 1 ounce (49 nuts) 3.0  Sunflower kernels 1 ounce 3.0  *Rounded to nearest 0.5 gram. Source: [employer] for Standard Reference, Legacy Release    The origin of pelvic floor muscle spasm can be multifactorial, including primary, reactive to a different pain source, trauma, or even part of a centralized pain syndrome.Treatment options include pelvic floor physical therapy, local (vaginal) or oral  muscle relaxants, pelvic muscle trigger point injections or centrally acting pain medications.     Try pelvic binder and sleep with a pillow between your legs for your back pain.

## 2023-07-05 NOTE — Assessment & Plan Note (Signed)
-   bilateral SI Joint pain and pelvic girdle pain, improved by stabilization of ASIS - pelvic floor myofascial pain bilaterally - encouraged pelvic binder - referral to pelvic floor PT - encouraged to sleep on her side with pillow between her legs to maintain hips in neutral position - encouraged weight reduction - The origin of pelvic floor muscle spasm can be multifactorial, including primary, reactive to a different pain source, trauma, or even part of a centralized pain syndrome.Treatment options include pelvic floor physical therapy, local (vaginal) or oral  muscle relaxants, pelvic muscle trigger point injections or centrally acting pain medications.   - encouraged pelvic floor relaxation and optimization of stool consistency

## 2023-07-11 ENCOUNTER — Telehealth: Payer: Self-pay

## 2023-07-11 NOTE — Telephone Encounter (Signed)
 Patient called stating Shelby Newton is over is $$500 and it's extremely expensive for her and the tier exception isn't going to be cheap enough for her to afford. She wants to know if there is a much cheaper medication that you can prescribe?

## 2023-07-12 NOTE — Telephone Encounter (Signed)
 Patient has been notified

## 2023-08-11 ENCOUNTER — Ambulatory Visit: Payer: Medicare Other | Admitting: Obstetrics

## 2023-08-25 ENCOUNTER — Ambulatory Visit: Admitting: Obstetrics

## 2023-08-30 ENCOUNTER — Encounter: Payer: Self-pay | Admitting: Physical Therapy

## 2023-08-30 ENCOUNTER — Ambulatory Visit: Attending: Obstetrics | Admitting: Physical Therapy

## 2023-08-30 ENCOUNTER — Other Ambulatory Visit: Payer: Self-pay

## 2023-08-30 DIAGNOSIS — M6281 Muscle weakness (generalized): Secondary | ICD-10-CM | POA: Insufficient documentation

## 2023-08-30 DIAGNOSIS — R293 Abnormal posture: Secondary | ICD-10-CM | POA: Insufficient documentation

## 2023-08-30 DIAGNOSIS — N3946 Mixed incontinence: Secondary | ICD-10-CM | POA: Insufficient documentation

## 2023-08-30 DIAGNOSIS — R279 Unspecified lack of coordination: Secondary | ICD-10-CM | POA: Insufficient documentation

## 2023-08-30 NOTE — Therapy (Signed)
 OUTPATIENT PHYSICAL THERAPY FEMALE PELVIC EVALUATION   Patient Name: SAMANTHAJO PAYANO MRN: [id number] DOB:June 02, 1945, 78 y.o., female Today's Date: 08/30/2023  END OF SESSION:  PT End of Session - 08/30/23 1247     Visit Number 1    Date for PT Re-Evaluation 11/30/23    Authorization Type MCR    Progress Note Due on Visit 10    PT Start Time 1016    PT Stop Time 1103    PT Time Calculation (min) 47 min    Activity Tolerance Patient tolerated treatment well    Behavior During Therapy WFL for tasks assessed/performed             Past Medical History:  Diagnosis Date   Arthritis    "ALITTLE IN HER BACK"   GERD (gastroesophageal reflux disease)    TAKES ZANTAC PRIOR TO TOMATOES   H/O seasonal allergies    Hypertension    ON MEDS 10-12 YRS NOW   Past Surgical History:  Procedure Laterality Date   ABDOMINAL HYSTERECTOMY     APPENDECTOMY     bladder     bladder tack   ovary cyst Right    TRANSFORAMINAL LUMBAR INTERBODY FUSION (TLIF) WITH PEDICLE SCREW FIXATION 1 LEVEL N/A 08/20/2012   Procedure: TRANSFORAMINAL LUMBAR INTERBODY FUSION (TLIF) WITH PEDICLE SCREW FIXATION ONE LEVEL;  Surgeon: Elder Greening, MD;  Location: [facility];  Service: Neurosurgery;  Laterality: N/A;  Posteior Lumbar Four-Five Transforaminal Interbody and Fusion with Interbody Prothesis, Posterolateral Arthrodesis, and Posterior Nonsegmental Instrumentation   Patient Active Problem List   Diagnosis Date Noted   Mixed stress and urge urinary incontinence 07/05/2023   History of pelvic surgery 07/05/2023   History of recurrent UTI (urinary tract infection) 07/05/2023   Pelvic pain 07/05/2023   Pelvic organ prolapse quantification stage 2 rectocele 07/05/2023   BMI 35.0-35.9,adult 07/05/2023   Constipation 07/05/2023    PCP: Tena Feeling, MD   REFERRING PROVIDER: Darlene Ehlers, MD  REFERRING DIAG: N39.46 (ICD-10-CM) - Mixed stress and urge urinary incontinence  THERAPY DIAG:  Muscle  weakness (generalized)  Abnormal posture  Unspecified lack of coordination  Mixed incontinence  Rationale for Evaluation and Treatment: Rehabilitation  ONSET DATE: years  SUBJECTIVE:                                                                                                                                                                                           SUBJECTIVE STATEMENT: Prolapsed bladder grade 2, was given sample of estrogen now out and was placing cream around vaginal opening.  Urinary incontinence with coughing and sneezing and urge especially  in the morning. Urinating every 3 hours. Did see improvement with gemtesa  and worked well for urinary symptoms but too expensive and did have side effects.  Fluid intake: water - ~51 oz; sometimes tea, cut coffee, maybe 1 soda (not full can) daily.   PAIN:  Are you having pain? Yes NPRS scale: 5-8/10 Pain location: Right hip - has bursitis and arthritis at Rt knee  Pain type: achy Pain description: constant   Aggravating factors: walking 20 mins max, prolonged standing 15 mins Relieving factors: shorter time walking does lessen weight on Rt leg  PRECAUTIONS: None  RED FLAGS: None   WEIGHT BEARING RESTRICTIONS: No  FALLS:  Has patient fallen in last 6 months? Yes. Number of falls 1 on a step  OCCUPATION: retired  ACTIVITY LEVEL : low  PLOF: Independent  PATIENT GOALS: to feel stronger and have urinary incontinence and hopefully lessen prolapse   PERTINENT HISTORY:   evaluation of stage II pelvic organ prolapse, hysterectomy at 33 with history of of bladder tack at that point, constipation, pelvic floor myofascial pain, mixed urinary incontinence. Sexual abuse: No  BOWEL MOVEMENT: Pain with bowel movement: No Type of bowel movement:Type (Bristol Stool Scale) 1-7, Frequency every other day, and Strain sometimes Fully empty rectum: No Leakage: No Pads: No Fiber supplement/laxative started metamucil and  increasing water   URINATION: Pain with urination: No Fully empty bladder: No Stream: Strong Urgency: Yes  and No - sometimes Frequency: around 3 hours Leakage: Urge to void, Walking to the bathroom, Coughing, Sneezing, Laughing, and Exercise Pads: Yes: liners  INTERCOURSE:  Ability to have vaginal penetration No  Pain with intercourse: none DrynessYes  Climax: not painful  Marinoff Scale: 0/3 Lubricate yes   PREGNANCY: Vaginal deliveries 2  Episiotomy Yes 2 C-section deliveries 0 Currently pregnant No  PROLAPSE: Pressure and Bulge   OBJECTIVE:  Note: Objective measures were completed at Evaluation unless otherwise noted.  DIAGNOSTIC FINDINGS:    COGNITION: Overall cognitive status: Within functional limits for tasks assessed     SENSATION: Light touch: Appears intact  LUMBAR SPECIAL TESTS:  Single leg stance test: unable to maintain balance  FUNCTIONAL TESTS:  Functional squat - bil knee valgus Lt worse than Rt, decreased descent by 50%; sit up test 0/3  GAIT: WFL  POSTURE: rounded shoulders, forward head, and posterior pelvic tilt   LUMBARAROM/PROM:  A/PROM A/PROM  eval  Flexion Limited by 25%  Extension WFL  Right lateral flexion Limited by 25%  Left lateral flexion Limited by 25%  Right rotation Limited by 25%  Left rotation Limited by 25%   (Blank rows = not tested)  LOWER EXTREMITY ROM:  Bil hip IR limited by 25%, hamstrings and adductors limited by 25%  LOWER EXTREMITY MMT:  Bil hips grossly 3+/5; knees 4+/5 PALPATION:   General: tightness at bil paraspinals and piriformis   Pelvic Alignment: WFL  Abdominal: tightness in all quadrants but no TTP                External Perineal Exam: agreeable next session                              Internal Pelvic Floor: agreeable next session   Patient confirms identification and approves PT to assess internal pelvic floor and treatment Yes but next session   PELVIC MMT:   MMT eval   Vaginal   Internal Anal Sphincter   External Anal Sphincter  Puborectalis   Diastasis Recti   (Blank rows = not tested)        TONE: Agreeable next session  PROLAPSE: Agreeable next session   TODAY'S TREATMENT:                                                                                                                              DATE:   EVAL 5/21: Examination completed, findings reviewed, pt educated on POC, HEP, and voiding mechanics and abdominal massage, female pelvic floor anatomy, prolapse mechanics. Pt motivated to participate in PT and agreeable to attempt recommendations.     PATIENT EDUCATION:  Education details: [id number], voiding mechanics and abdominal massage, female pelvic floor anatomy, prolapse mechanics  Person educated: Patient Education method: Explanation, Demonstration, Tactile cues, Verbal cues, and Handouts Education comprehension: verbalized understanding, returned demonstration, verbal cues required, tactile cues required, and needs further education  HOME EXERCISE PROGRAM: [id number]   ASSESSMENT:  CLINICAL IMPRESSION: Patient is a 78 y.o. female  who was seen today for physical therapy evaluation and treatment for urinary incontinence with prolapse. Pt reports she has chronic constipation but tries not to strain and does have sensation of prolapse regularly as well as urinary incontinence. Pt found to have decreased core and hip strength evident by squat mechanics and sit up test as well as MMTs, impaired balance with single leg stance attempts and mild step strategy required for trunk mobility testing which also demonstrated decreased flexibility in spine and bil hips.  Pt agreeable to internal pelvic floor assessment but for next session as today pt had several questions about pelvic floor and constipation all answered and treatment focused on this today. Pt would benefit from additional PT to further address deficits.    OBJECTIVE IMPAIRMENTS:  decreased activity tolerance, decreased balance, decreased coordination, decreased endurance, decreased mobility, decreased strength, increased fascial restrictions, impaired flexibility, improper body mechanics, and postural dysfunction.   ACTIVITY LIMITATIONS: carrying, lifting, bending, squatting, continence, and locomotion level  PARTICIPATION LIMITATIONS: meal prep, cleaning, laundry, interpersonal relationship, community activity, and yard work  PERSONAL FACTORS: Fitness, Time since onset of injury/illness/exacerbation, and 1 comorbidity: medical history are also affecting patient's functional outcome.   REHAB POTENTIAL: Good  CLINICAL DECISION MAKING: Stable/uncomplicated  EVALUATION COMPLEXITY: Low   GOALS: Goals reviewed with patient? Yes  SHORT TERM GOALS: Target date: 09/27/23  Pt to be I with HEP for carry over and continuing recommendations for improved outcomes.   Baseline: Goal status: INITIAL  2.  Pt will be independent with the knack, urge suppression technique, and double voiding in order to improve bladder habits and decrease urinary incontinence.   Baseline:  Goal status: INITIAL  3.  Pt will be independent with use of squatty potty, relaxed toileting mechanics, and improved bowel movement techniques in order to increase ease of bowel movements and complete evacuation.   Baseline:  Goal status: INITIAL  4.  Pt to be I with pressure management for decreased strain  at prolapse/pelvic floor for improved tolerance to walking for 30 mins.  Baseline:  Goal status: INITIAL   LONG TERM GOALS: Target date: 11/30/23  Pt to be I with advanced HEP for carry over and continuing recommendations for improved outcomes.   Baseline:  Goal status: INITIAL  2.  Pt to demonstrate improved coordination of pelvic floor and breathing mechanics with 10# squat with appropriate synergistic patterns to decrease pain and leakage at least 75% of the time for improved ability to  complete a 30 minute workout with strain at pelvic floor and symptoms.    Baseline:  Goal status: INITIAL  3.  Pt to report no more than 1 instance of urinary incontinence in a day for improved skin integrity and confidence with community outings  Baseline:  Goal status: INITIAL  4.  Pt to demonstrate at least 4+/5 bil hip strength for improved pelvic stability and functional squats without leakage skin integrity and confidence with community outings .  Baseline:  Goal status: INITIAL   PLAN:  PT FREQUENCY: 2x/week  PT DURATION: 10 sessions  PLANNED INTERVENTIONS: 97110-Therapeutic exercises, 97530- Therapeutic activity, 97112- Neuromuscular re-education, 97535- Self Care, [id number]- Manual therapy, Patient/Family education, Taping, Dry Needling, Joint mobilization, Spinal mobilization, Scar mobilization, DME instructions, Cryotherapy, Moist heat, and Biofeedback  PLAN FOR NEXT SESSION: core and hip strengthening, coordination of pelvic floor, knack, urge drill, pelvic floor assess   Avie Lemme, PT, DPT 05/21/251:02 PM

## 2023-08-30 NOTE — Patient Instructions (Signed)
  3-5 mins 2x daily (am/pm) while lying down gentle circles, shouldn't be painful.

## 2023-09-14 ENCOUNTER — Ambulatory Visit: Payer: Self-pay | Attending: Obstetrics | Admitting: Physical Therapy

## 2023-09-14 DIAGNOSIS — R279 Unspecified lack of coordination: Secondary | ICD-10-CM | POA: Insufficient documentation

## 2023-09-14 DIAGNOSIS — R293 Abnormal posture: Secondary | ICD-10-CM | POA: Insufficient documentation

## 2023-09-14 DIAGNOSIS — M6281 Muscle weakness (generalized): Secondary | ICD-10-CM | POA: Insufficient documentation

## 2023-09-14 NOTE — Therapy (Signed)
 OUTPATIENT PHYSICAL THERAPY FEMALE PELVIC EVALUATION   Patient Name: Shelby Newton MRN: [id number] DOB:[birth year], 78 y.o., female Today's Date: 09/14/2023  END OF SESSION:  PT End of Session - 09/14/23 0936     Visit Number 2    Date for PT Re-Evaluation 11/30/23    Authorization Type MCR    Progress Note Due on Visit 10    PT Start Time 0934    PT Stop Time 1015    PT Time Calculation (min) 41 min    Activity Tolerance Patient tolerated treatment well    Behavior During Therapy WFL for tasks assessed/performed             Past Medical History:  Diagnosis Date   Arthritis    "ALITTLE IN HER BACK"   GERD (gastroesophageal reflux disease)    TAKES ZANTAC PRIOR TO TOMATOES   H/O seasonal allergies    Hypertension    ON MEDS 10-12 YRS NOW   Past Surgical History:  Procedure Laterality Date   ABDOMINAL HYSTERECTOMY     APPENDECTOMY     bladder     bladder tack   ovary cyst Right    TRANSFORAMINAL LUMBAR INTERBODY FUSION (TLIF) WITH PEDICLE SCREW FIXATION 1 LEVEL N/A 08/20/2012   Procedure: TRANSFORAMINAL LUMBAR INTERBODY FUSION (TLIF) WITH PEDICLE SCREW FIXATION ONE LEVEL;  Surgeon: Elder Greening, MD;  Location: [facility];  Service: Neurosurgery;  Laterality: N/A;  Posteior Lumbar Four-Five Transforaminal Interbody and Fusion with Interbody Prothesis, Posterolateral Arthrodesis, and Posterior Nonsegmental Instrumentation   Patient Active Problem List   Diagnosis Date Noted   Mixed stress and urge urinary incontinence 07/05/2023   History of pelvic surgery 07/05/2023   History of recurrent UTI (urinary tract infection) 07/05/2023   Pelvic pain 07/05/2023   Pelvic organ prolapse quantification stage 2 rectocele 07/05/2023   BMI 35.0-35.9,adult 07/05/2023   Constipation 07/05/2023    PCP: Tena Feeling, MD   REFERRING PROVIDER: Darlene Ehlers, MD  REFERRING DIAG: N39.46 (ICD-10-CM) - Mixed stress and urge urinary incontinence  THERAPY DIAG:  Muscle  weakness (generalized)  Abnormal posture  Unspecified lack of coordination  Rationale for Evaluation and Treatment: Rehabilitation  ONSET DATE: years  SUBJECTIVE:                                                                                                                                                                                           SUBJECTIVE STATEMENT: Having less urinary incontinence going to bathroom only with urge but, no pressure/heaviness. Bowel movement have improved with abdominal massage and step stool use having no discomfort and emptying much more  quickly without having to stain. Doesn't have full evacuation every time. Stomach is a little upset today and would like to wait on internal   Fluid intake: water - ~51 oz; sometimes tea, cut coffee, maybe 1 soda (not full can) daily.   PAIN:  Are you having pain? Yes NPRS scale: 5-8/10 Pain location: Right hip - has bursitis and arthritis at Rt knee  Pain type: achy Pain description: constant   Aggravating factors: walking 20 mins max, prolonged standing 15 mins Relieving factors: shorter time walking does lessen weight on Rt leg  PRECAUTIONS: None  RED FLAGS: None   WEIGHT BEARING RESTRICTIONS: No  FALLS:  Has patient fallen in last 6 months? Yes. Number of falls 1 on a step  OCCUPATION: retired  ACTIVITY LEVEL : low  PLOF: Independent  PATIENT GOALS: to feel stronger and have urinary incontinence and hopefully lessen prolapse   PERTINENT HISTORY:   evaluation of stage II pelvic organ prolapse, hysterectomy at 33 with history of of bladder tack at that point, constipation, pelvic floor myofascial pain, mixed urinary incontinence. Sexual abuse: No  BOWEL MOVEMENT: Pain with bowel movement: No Type of bowel movement:Type (Bristol Stool Scale) 1-7, Frequency every other day, and Strain sometimes Fully empty rectum: No Leakage: No Pads: No Fiber supplement/laxative started metamucil and  increasing water   URINATION: Pain with urination: No Fully empty bladder: No Stream: Strong Urgency: Yes  and No - sometimes Frequency: around 3 hours Leakage: Urge to void, Walking to the bathroom, Coughing, Sneezing, Laughing, and Exercise Pads: Yes: liners  INTERCOURSE:  Ability to have vaginal penetration No  Pain with intercourse: none DrynessYes  Climax: not painful  Marinoff Scale: 0/3 Lubricate yes   PREGNANCY: Vaginal deliveries 2  Episiotomy Yes 2 C-section deliveries 0 Currently pregnant No  PROLAPSE: Pressure and Bulge   OBJECTIVE:  Note: Objective measures were completed at Evaluation unless otherwise noted.  DIAGNOSTIC FINDINGS:    COGNITION: Overall cognitive status: Within functional limits for tasks assessed     SENSATION: Light touch: Appears intact  LUMBAR SPECIAL TESTS:  Single leg stance test: unable to maintain balance  FUNCTIONAL TESTS:  Functional squat - bil knee valgus Lt worse than Rt, decreased descent by 50%; sit up test 0/3  GAIT: WFL  POSTURE: rounded shoulders, forward head, and posterior pelvic tilt   LUMBARAROM/PROM:  A/PROM A/PROM  eval  Flexion Limited by 25%  Extension WFL  Right lateral flexion Limited by 25%  Left lateral flexion Limited by 25%  Right rotation Limited by 25%  Left rotation Limited by 25%   (Blank rows = not tested)  LOWER EXTREMITY ROM:  Bil hip IR limited by 25%, hamstrings and adductors limited by 25%  LOWER EXTREMITY MMT:  Bil hips grossly 3+/5; knees 4+/5 PALPATION:   General: tightness at bil paraspinals and piriformis   Pelvic Alignment: WFL  Abdominal: tightness in all quadrants but no TTP                External Perineal Exam: agreeable next session                              Internal Pelvic Floor: agreeable next session   Patient confirms identification and approves PT to assess internal pelvic floor and treatment Yes but next session   PELVIC MMT:   MMT eval   Vaginal   Internal Anal Sphincter   External Anal Sphincter  Puborectalis   Diastasis Recti   (Blank rows = not tested)        TONE: Agreeable next session  PROLAPSE: Agreeable next session   TODAY'S TREATMENT:                                                                                                                              DATE:   EVAL 5/21: Examination completed, findings reviewed, pt educated on POC, HEP, and voiding mechanics and abdominal massage, female pelvic floor anatomy, prolapse mechanics. Pt motivated to participate in PT and agreeable to attempt recommendations.    09/14/23: Reviewed prolapse relief positions, HEP, fiber types, voiding mechanics Modified cat cow in sitting  x10 Open books x10 each Lumbar rotations 2x10 Diaphragmatic breathing in sitting with gentle transverse abdominis activation 2x10  PATIENT EDUCATION:  Education details: [id number], voiding mechanics and abdominal massage, female pelvic floor anatomy, prolapse mechanics  Person educated: Patient Education method: Explanation, Demonstration, Tactile cues, Verbal cues, and Handouts Education comprehension: verbalized understanding, returned demonstration, verbal cues required, tactile cues required, and needs further education  HOME EXERCISE PROGRAM: [id number]   ASSESSMENT:  CLINICAL IMPRESSION: Patient is a 78 y.o. female  who was seen today for physical therapy treatment for urinary incontinence with prolapse. Pt is reporting great improvements so far, very pleased with progress. Pt tolerated session well and focus on gentle trunk mobility to decrease restrictions at bowels and pelvic floor. Pt denied pain and reports this felt a lot better. Pt benefited from moderate cues for techniques throughout Pt would benefit from additional PT to further address deficits.    OBJECTIVE IMPAIRMENTS: decreased activity tolerance, decreased balance, decreased coordination, decreased endurance,  decreased mobility, decreased strength, increased fascial restrictions, impaired flexibility, improper body mechanics, and postural dysfunction.   ACTIVITY LIMITATIONS: carrying, lifting, bending, squatting, continence, and locomotion level  PARTICIPATION LIMITATIONS: meal prep, cleaning, laundry, interpersonal relationship, community activity, and yard work  PERSONAL FACTORS: Fitness, Time since onset of injury/illness/exacerbation, and 1 comorbidity: medical history are also affecting patient's functional outcome.   REHAB POTENTIAL: Good  CLINICAL DECISION MAKING: Stable/uncomplicated  EVALUATION COMPLEXITY: Low   GOALS: Goals reviewed with patient? Yes  SHORT TERM GOALS: Target date: 09/27/23  Pt to be I with HEP for carry over and continuing recommendations for improved outcomes.   Baseline: Goal status: INITIAL  2.  Pt will be independent with the knack, urge suppression technique, and double voiding in order to improve bladder habits and decrease urinary incontinence.   Baseline:  Goal status: INITIAL  3.  Pt will be independent with use of squatty potty, relaxed toileting mechanics, and improved bowel movement techniques in order to increase ease of bowel movements and complete evacuation.   Baseline:  Goal status: INITIAL  4.  Pt to be I with pressure management for decreased strain at prolapse/pelvic floor for improved tolerance to walking for 30 mins.  Baseline:  Goal status: INITIAL   LONG TERM GOALS:  Target date: 11/30/23  Pt to be I with advanced HEP for carry over and continuing recommendations for improved outcomes.   Baseline:  Goal status: INITIAL  2.  Pt to demonstrate improved coordination of pelvic floor and breathing mechanics with 10# squat with appropriate synergistic patterns to decrease pain and leakage at least 75% of the time for improved ability to complete a 30 minute workout with strain at pelvic floor and symptoms.    Baseline:  Goal status:  INITIAL  3.  Pt to report no more than 1 instance of urinary incontinence in a day for improved skin integrity and confidence with community outings  Baseline:  Goal status: INITIAL  4.  Pt to demonstrate at least 4+/5 bil hip strength for improved pelvic stability and functional squats without leakage skin integrity and confidence with community outings .  Baseline:  Goal status: INITIAL   PLAN:  PT FREQUENCY: 2x/week  PT DURATION: 10 sessions  PLANNED INTERVENTIONS: 97110-Therapeutic exercises, 97530- Therapeutic activity, 97112- Neuromuscular re-education, 97535- Self Care, 16109- Manual therapy, Patient/Family education, Taping, Dry Needling, Joint mobilization, Spinal mobilization, Scar mobilization, DME instructions, Cryotherapy, Moist heat, and Biofeedback  PLAN FOR NEXT SESSION: core and hip strengthening, coordination of pelvic floor, knack, urge drill, pelvic floor assess   Avie Lemme, PT, DPT 09/13/2508:24 AM

## 2023-09-14 NOTE — Patient Instructions (Addendum)
  15-20 mins in the evenings based on tolerance.  Please clear any positions with doctor as needed.  Please stop any position if negative symptoms occur (dizziness/lightheadedness/fatigue/increased pain, etc.)    Types of Fiber  There are two main types of fiber:  insoluble and soluble.  Both of these types can prevent and relieve constipation and diarrhea, although some people find one or the other to be more easily digested.  This handout details information about both types of fiber. recommended 25-35 grams of fiber per day,  average 9-12 grams per meal   key is a balance between soluble and insoluble  Insoluble Fiber (for softening)       Functions of Insoluble Fiber moves bulk through the intestines  controls and balances the pH (acidity) in the intestines   This type of fiber should be avoided or reduced if you have soft, frequent bowel movements or leakage      Benefits of Insoluble Fiber promotes regular bowel movement and prevents constipation  removes fecal waste through colon in less time  keeps an optimal pH in intestines to prevent microbes from producing cancer substances, therefore preventing colon cancer        Food Sources of Insoluble Fiber whole-wheat products  wheat bran "miller's bran" corn bran  flax seed or other seeds vegetables such as green beans, broccoli, cauliflower and potato skins  fruit skins and root vegetable skins  popcorn brown rice  Soluble Fiber( Types 5,6,7- soft/liquid stools to bulk)       Functions of Soluble Fiber  holds water in the colon to bulk and soften the stool prolongs stomach emptying time so that sugar is released and absorbed more slowly  prevent leakage associated with soft, frequent bowel movements.        Benefits of Soluble Fiber lowers total cholesterol and LDL cholesterol (the bad cholesterol) therefore reducing the risk of heart disease  regulates blood sugar for people with diabetes       Food Sources of Soluble  Fiber oat/oat bran dried beans and peas  nuts  barley  flax seed or other seeds fruits such as oranges, pears, peaches, and apples  vegetables such as carrots  psyllium husk  prunes

## 2023-10-04 ENCOUNTER — Ambulatory Visit (INDEPENDENT_AMBULATORY_CARE_PROVIDER_SITE_OTHER): Admitting: Obstetrics

## 2023-10-04 ENCOUNTER — Encounter: Payer: Self-pay | Admitting: Obstetrics

## 2023-10-04 VITALS — BP 128/66 | HR 80

## 2023-10-04 DIAGNOSIS — Z87440 Personal history of urinary (tract) infections: Secondary | ICD-10-CM | POA: Diagnosis not present

## 2023-10-04 DIAGNOSIS — N3946 Mixed incontinence: Secondary | ICD-10-CM | POA: Diagnosis not present

## 2023-10-04 DIAGNOSIS — K5900 Constipation, unspecified: Secondary | ICD-10-CM

## 2023-10-04 DIAGNOSIS — N816 Rectocele: Secondary | ICD-10-CM | POA: Diagnosis not present

## 2023-10-04 MED ORDER — ESTRADIOL 0.1 MG/GM VA CREA
TOPICAL_CREAM | VAGINAL | 3 refills | Status: AC
Start: 2023-10-04 — End: ?

## 2023-10-04 NOTE — Assessment & Plan Note (Addendum)
-   07/05/23 POCT UA + Protein, PVR 68mL - We discussed the symptoms of overactive bladder (OAB), which include urinary urgency, urinary frequency, nocturia, with or without urge incontinence.  While we do not know the exact etiology of OAB, several treatment options exist. We discussed management including behavioral therapy (decreasing bladder irritants, urge suppression strategies, timed voids, bladder retraining), physical therapy, medication; for refractory cases posterior tibial nerve stimulation, sacral neuromodulation, and intravesical botulinum toxin injection.  For anticholinergic medications, we discussed the potential side effects of anticholinergics including dry eyes, dry mouth, constipation, cognitive impairment and urinary retention. For Beta-3 agonist medication, we discussed the potential side effect of elevated blood pressure which is more likely to occur in individuals with uncontrolled hypertension. - samples for trial of Gemtesa  - encouraged bowel regimen to avoid return of constipation - UUI resolved since last visit - For treatment of stress urinary incontinence,  non-surgical options include expectant management, weight loss, physical therapy, as well as a pessary.  Surgical options include a midurethral sling, Burch urethropexy, and transurethral injection of a bulking agent. - continue low dose vaginal estrogen and pelvic floor PT due to reduced SUI symptoms

## 2023-10-04 NOTE — Patient Instructions (Signed)
 Continue to monitor your urinary or bowel symptoms, please return if you experience any change in symptoms.   Continue vaginal estrogen cream 1g twice a week for recurrent UTI.   Continue bowel regimen to avoid constipation and straining.   Good job!!!!!!!!

## 2023-10-04 NOTE — Assessment & Plan Note (Signed)
-   07/05/23 POCT UA + protein only and PVR 68mL - For treatment of recurrent urinary tract infections, we discussed management of recurrent UTIs including prophylaxis with a daily low dose antibiotic, transvaginal estrogen therapy, D-mannose, and cranberry supplements.  We discussed the role of diagnostic testing such as cystoscopy and upper tract imaging if refractory symptoms - continue vaginal estrogen 1g twice a week, denies UTI since starting estrogen - encouraged to monitor for change in urinary symptoms - prior use of cefpodoxime daily suppression 3-4 years ago and evaluated by urology - history of urethral dilation for bladder pain syndrome

## 2023-10-04 NOTE — Assessment & Plan Note (Signed)
-   For constipation, we reviewed the importance of a better bowel regimen.  We also discussed the importance of avoiding chronic straining, as it can exacerbate her pelvic floor symptoms; we discussed treating constipation and straining prior to surgery, as postoperative straining can lead to damage to the repair and recurrence of symptoms. We discussed initiating therapy with increasing fluid intake, fiber supplementation, stool softeners, and laxatives such as miralax.  - samples of Miralax  previously provided - dislikes taste of fiber supplementation - encouraged to continue squatting position for bowel movements for pelvic floor relaxation and avoid straining

## 2023-10-04 NOTE — Progress Notes (Signed)
 [facility] Return Visit  SUBJECTIVE  History of Present Illness: Shelby Newton is a 78 y.o. female seen in follow-up for mixed urinary incontinence, history of pelvic surgery, history of recurrent UTI, pelvic pain, stage II pelvic organ prolapse, and constipation. Plan at last visit was gemtesa , vaginal estrogen, referral to PT.   Pt reports doing well since last visit. Denies bulge symptoms since using squatty potty and starting PT Dislikes taste of fiber supplementation Denies UTI since last visit, desires to continue vaginal estrogen Reports SUI x 1 since starting PT, denies UUI.   Past Medical History: Patient  has a past medical history of Arthritis, GERD (gastroesophageal reflux disease), H/O seasonal allergies, and Hypertension.   Past Surgical History: She  has a past surgical history that includes Appendectomy; Abdominal hysterectomy; Transforaminal lumbar interbody fusion (tlif) with pedicle screw fixation 1 level (N/A, 08/20/2012); ovary cyst (Right); and bladder.   Medications: She has a current medication list which includes the following prescription(s): amlodipine, cholecalciferol, omega-3 fatty acids, valsartan -hydrochlorothiazide , and estradiol .   Allergies: Patient is allergic to sulfa antibiotics, macrodantin [nitrofurantoin macrocrystal], vicodin [hydrocodone -acetaminophen ], and yellow dyes (non-tartrazine).   Social History: Patient  reports that she has never smoked. She has never used smokeless tobacco. She reports that she does not drink alcohol and does not use drugs.     OBJECTIVE     Physical Exam: Vitals:   10/04/23 0933  BP: 128/66  Pulse: 80   Gen: No apparent distress, A&O x 3.  Detailed Urogynecologic Evaluation:  Deferred.       ASSESSMENT AND PLAN    Shelby Newton is a 78 y.o. with:  1. History of recurrent UTI (urinary tract infection)   2. Mixed stress and urge urinary incontinence   3. Constipation, unspecified  constipation type   4. 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  We discussed the role of diagnostic testing such as cystoscopy and upper tract imaging if refractory symptoms - continue vaginal estrogen 1g twice a week, denies UTI since starting estrogen - encouraged to monitor for change in urinary symptoms - prior use of cefpodoxime daily suppression 3-4 years ago and evaluated by urology - history of urethral dilation for bladder pain syndrome  Orders: -     Estradiol ; Place 1g at night twice a week after  Dispense: 30 g; Refill: 3  Mixed stress and urge urinary incontinence Assessment & Plan: - 07/05/23 POCT UA + Protein, PVR 68mL - We discussed the symptoms of overactive bladder (OAB), which include urinary urgency, urinary frequency, nocturia, with or without urge incontinence.  While we do not know the exact etiology of OAB, several treatment options exist. We discussed management including behavioral therapy (decreasing bladder irritants, urge suppression strategies, timed voids, bladder retraining), physical therapy, medication; for refractory cases posterior tibial nerve stimulation, sacral neuromodulation, and intravesical botulinum toxin injection.  For anticholinergic medications, we discussed the potential side effects of anticholinergics including dry eyes, dry mouth, constipation, cognitive impairment and urinary retention. For Beta-3 agonist medication, we discussed the potential side effect of elevated blood pressure which is more likely to occur in individuals with uncontrolled hypertension. - samples for trial of Gemtesa  -  encouraged bowel regimen to avoid return of constipation - UUI resolved since last visit - For treatment of stress urinary incontinence,  non-surgical options include expectant management, weight loss, physical therapy, as well as a pessary.  Surgical options include a midurethral sling, Burch urethropexy, and transurethral injection of a bulking agent. - continue low dose vaginal estrogen and pelvic floor PT due to reduced SUI symptoms  Orders: -     Estradiol ; Place 1g at night twice a week after  Dispense: 30 g; Refill: 3  Constipation, unspecified constipation type Assessment & Plan: - For constipation, we reviewed the importance of a better bowel regimen.  We also discussed the importance of avoiding chronic straining, as it can exacerbate her pelvic floor symptoms; we discussed treating constipation and straining prior to surgery, as postoperative straining can lead to damage to the repair and recurrence of symptoms. We discussed initiating therapy with increasing fluid intake, fiber supplementation, stool softeners, and laxatives such as miralax.  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 Lajoyce for asymptomatic bladder bulge in [name]  in 1982 - For treatment of pelvic organ prolapse, we discussed options for management including expectant management, conservative management, and surgical management, such as Kegels, a pessary, pelvic floor physical therapy, and specific surgical procedures. - pt desires to avoid surgical intervention - continue pelvic floor PT due to significant improvement of bulge symptoms - continue to optimize stool consistency   Time spent: I spent 10 minutes dedicated to the care of this  patient on the date of this encounter to include pre-visit review of records, face-to-face time with the patient discussing stage II pelvic organ prolapse, constipation, mixed urinary incontinence, history of recurrent UTI, and post visit documentation and ordering medication/ testing.    Lianne ONEIDA Gillis, MD

## 2023-10-04 NOTE — Assessment & Plan Note (Signed)
-   history of bladder tack by Dr. Lajoyce for asymptomatic bladder bulge in [place], [place]  in 1982 - For treatment of pelvic organ prolapse, we discussed options for management including expectant management, conservative management, and surgical management, such as Kegels, a pessary, pelvic floor physical therapy, and specific surgical procedures. - pt desires to avoid surgical intervention - continue pelvic floor PT due to significant improvement of bulge symptoms - continue to optimize stool consistency

## 2024-02-27 ENCOUNTER — Other Ambulatory Visit: Payer: Self-pay

## 2024-02-27 ENCOUNTER — Emergency Department (HOSPITAL_BASED_OUTPATIENT_CLINIC_OR_DEPARTMENT_OTHER)

## 2024-02-27 ENCOUNTER — Emergency Department (HOSPITAL_BASED_OUTPATIENT_CLINIC_OR_DEPARTMENT_OTHER)
Admission: EM | Admit: 2024-02-27 | Discharge: 2024-02-27 | Disposition: A | Discharge status: Home / Self Care | Attending: Emergency Medicine | Admitting: Emergency Medicine

## 2024-02-27 DIAGNOSIS — R531 Weakness: Secondary | ICD-10-CM | POA: Insufficient documentation

## 2024-02-27 DIAGNOSIS — R519 Headache, unspecified: Secondary | ICD-10-CM | POA: Insufficient documentation

## 2024-02-27 DIAGNOSIS — R5383 Other fatigue: Secondary | ICD-10-CM | POA: Diagnosis not present

## 2024-02-27 DIAGNOSIS — R197 Diarrhea, unspecified: Secondary | ICD-10-CM | POA: Diagnosis not present

## 2024-02-27 DIAGNOSIS — R112 Nausea with vomiting, unspecified: Secondary | ICD-10-CM | POA: Insufficient documentation

## 2024-02-27 DIAGNOSIS — I10 Essential (primary) hypertension: Secondary | ICD-10-CM | POA: Insufficient documentation

## 2024-02-27 DIAGNOSIS — R42 Dizziness and giddiness: Secondary | ICD-10-CM | POA: Diagnosis not present

## 2024-02-27 DIAGNOSIS — D72829 Elevated white blood cell count, unspecified: Secondary | ICD-10-CM | POA: Insufficient documentation

## 2024-02-27 LAB — COMPREHENSIVE METABOLIC PANEL WITH GFR
ALT: 8 U/L (ref 0–44)
AST: 16 U/L (ref 15–41)
Albumin: 4.3 g/dL (ref 3.5–5.0)
Alkaline Phosphatase: 60 U/L (ref 38–126)
Anion gap: 12 (ref 5–15)
BUN: 20 mg/dL (ref 8–23)
CO2: 26 mmol/L (ref 22–32)
Calcium: 9.6 mg/dL (ref 8.9–10.3)
Chloride: 102 mmol/L (ref 98–111)
Creatinine, Ser: 0.94 mg/dL (ref 0.44–1.00)
GFR, Estimated: >60 mL/min (ref >60)
Glucose, Bld: 132 mg/dL — ABNORMAL HIGH (ref 70–99)
Potassium: 4.3 mmol/L (ref 3.5–5.1)
Sodium: 140 mmol/L (ref 135–145)
Total Bilirubin: 0.3 mg/dL (ref 0.0–1.2)
Total Protein: 7.6 g/dL (ref 6.5–8.1)

## 2024-02-27 LAB — URINALYSIS, ROUTINE W REFLEX MICROSCOPIC
Bilirubin Urine: NEGATIVE
Glucose, UA: NEGATIVE mg/dL
Hgb urine dipstick: NEGATIVE
Ketones, ur: NEGATIVE mg/dL
Leukocytes,Ua: NEGATIVE
Nitrite: NEGATIVE
Protein, ur: NEGATIVE mg/dL
Specific Gravity, Urine: 1.025 (ref 1.005–1.030)
pH: 6 (ref 5.0–8.0)

## 2024-02-27 LAB — CBC
HCT: 41.4 % (ref 36.0–46.0)
Hemoglobin: 13.5 g/dL (ref 12.0–15.0)
MCH: 28.2 pg (ref 26.0–34.0)
MCHC: 32.6 g/dL (ref 30.0–36.0)
MCV: 86.6 fL (ref 80.0–100.0)
Platelets: 345 K/uL (ref 150–400)
RBC: 4.78 MIL/uL (ref 3.87–5.11)
RDW: 13.2 % (ref 11.5–15.5)
WBC: 11.3 K/uL — ABNORMAL HIGH (ref 4.0–10.5)
nRBC: 0 % (ref 0.0–0.2)

## 2024-02-27 LAB — RESP PANEL BY RT-PCR (RSV, FLU A&B, COVID)  RVPGX2
Influenza A by PCR: NEGATIVE
Influenza B by PCR: NEGATIVE
Resp Syncytial Virus by PCR: NEGATIVE
SARS Coronavirus 2 by RT PCR: NEGATIVE

## 2024-02-27 LAB — CBG MONITORING, ED: Glucose-Capillary: 122 mg/dL — ABNORMAL HIGH (ref 70–99)

## 2024-02-27 MED ORDER — ONDANSETRON 4 MG PO TBDP: 4.0000 mg | ORAL_TABLET | Freq: Three times a day (TID) | ORAL | 0 refills | Status: AC | PRN

## 2024-02-27 MED ORDER — SODIUM CHLORIDE 0.9 % IV BOLUS
1000.0000 mL | Freq: Once | INTRAVENOUS | Status: AC
  Administered 2024-02-27: 1000 mL via INTRAVENOUS

## 2024-02-27 MED ORDER — ONDANSETRON 4 MG PO TBDP
4.0000 mg | ORAL_TABLET | Freq: Once | ORAL | Status: AC
  Administered 2024-02-27: 4 mg via ORAL
  Filled 2024-02-27: qty 1

## 2024-02-27 NOTE — ED Notes (Signed)
Patient transported to CT and back without event.

## 2024-02-27 NOTE — ED Triage Notes (Signed)
 Reports since emesis, diarrhea and weakness since 10/31. Reports weakness has worsened today. Denies cp/sob.  Also endorses headache

## 2024-02-27 NOTE — ED Notes (Signed)
 [facility] 122

## 2024-02-27 NOTE — ED Provider Notes (Signed)
 [facility] Provider Note   CSN: [id number] Arrival date & time: 02/27/24  1617     Patient presents with: Weakness   Shelby Newton is a 78 y.o. female past medical history significant for GERD and hypertension presents today for fatigue, nausea, vomiting, and diarrhea since 10/31.  Patient initially had flulike symptoms approximately 10 days ago which have since resolved.  Patient does still have intermittent diarrhea.  Patient also reporting headache with some associated dizziness.  Patient reports increased generalized fatigue today.  Patient also endorses headache.  Patient denies chest pain, shortness of breath, fever, chills, hematemesis, blood in stool, dysuria, hematuria, neck stiffness, diplopia, tinnitus, any other complaints at this time.    Weakness Associated symptoms: diarrhea, nausea and vomiting        Prior to Admission medications   Medication Sig Start Date End Date Taking? Authorizing Provider  ondansetron  (ZOFRAN -ODT) 4 MG disintegrating tablet Take 1 tablet (4 mg total) by mouth every 8 (eight) hours as needed. 02/27/24  Yes Keoni Havey N, PA-C  amLODipine (NORVASC) 5 MG tablet Take 1 tablet by mouth daily.    [provider]  cholecalciferol (VITAMIN D3) 25 MCG (1000 UNIT) tablet 1 tablet    [provider]  estradiol  (ESTRACE ) 0.1 MG/GM vaginal cream Place 1g at night twice a week after 10/04/23   Guadlupe Lianne DASEN, MD  Omega-3 Fatty Acids (FISH OIL PO) Take by mouth.    [provider]  valsartan -hydrochlorothiazide  (DIOVAN -HCT) 320-12.5 MG per tablet Take 1 tablet by mouth daily.    [provider]    Allergies: Sulfa antibiotics, Macrodantin [nitrofurantoin macrocrystal], Vicodin [hydrocodone -acetaminophen ], and Yellow dyes (non-tartrazine)    Review of Systems  Constitutional:  Positive for fatigue.  Gastrointestinal:  Positive for diarrhea, nausea and vomiting.     Updated Vital Signs BP (!) 161/49   Pulse (!) 59   Temp 98.1 F (36.7 C) (Oral)   Resp 17   SpO2 96%   Physical Exam Vitals and nursing note reviewed.  Constitutional:      General: She is not in acute distress.    Appearance: She is well-developed. She is not toxic-appearing.  HENT:     Head: Normocephalic and atraumatic.     Right Ear: External ear normal.     Left Ear: External ear normal.  Eyes:     Extraocular Movements: Extraocular movements intact.     Conjunctiva/sclera: Conjunctivae normal.     Pupils: Pupils are equal, round, and reactive to light.  Cardiovascular:     Rate and Rhythm: Normal rate and regular rhythm.     Pulses: Normal pulses.     Heart sounds: Normal heart sounds. No murmur heard. Pulmonary:     Effort: Pulmonary effort is normal. No respiratory distress.     Breath sounds: Normal breath sounds.  Abdominal:     General: There is no distension.     Palpations: Abdomen is soft.     Tenderness: There is no abdominal tenderness.  Musculoskeletal:        General: No swelling.     Cervical back: Neck supple.     Right lower leg: No edema.     Left lower leg: No edema.  Skin:    General: Skin is warm and dry.     Capillary Refill: Capillary refill takes less than 2 seconds.     Coloration: Skin is pale.  Neurological:     General: No focal  deficit present.     Mental Status: She is alert and oriented to person, place, and time.     Cranial Nerves: No cranial nerve deficit.     Sensory: No sensory deficit.     Motor: No weakness.  Psychiatric:        Mood and Affect: Mood normal.     (all labs ordered are listed, but only abnormal results are displayed) Labs Reviewed  COMPREHENSIVE METABOLIC PANEL WITH GFR - Abnormal; Notable for the following components:      Result Value   Glucose, Bld 132 (*)    All other components within normal limits  CBC - Abnormal; Notable for the following components:   WBC 11.3 (*)    All other components  within normal limits  CBG MONITORING, ED - Abnormal; Notable for the following components:   Glucose-Capillary 122 (*)    All other components within normal limits  RESP PANEL BY RT-PCR (RSV, FLU A&B, COVID)  [id number]  GASTROINTESTINAL PANEL BY PCR, STOOL (REPLACES STOOL CULTURE)  URINALYSIS, ROUTINE W REFLEX MICROSCOPIC    EKG: EKG Interpretation Date/Time:  Tuesday February 27 2024 16:33:13 EST Ventricular Rate:  66 PR Interval:  160 QRS Duration:  103 QT Interval:  395 QTC Calculation: 414 R Axis:   18  Text Interpretation: Sinus rhythm Low voltage, precordial leads Confirmed by Lenor Hollering [phone]) on 02/27/2024 5:24:23 PM  Radiology: CT Head Wo Contrast Result Date: 02/27/2024 EXAM: CT HEAD WITHOUT CONTRAST 02/27/2024 06:42:54 PM TECHNIQUE: CT of the head was performed without the administration of intravenous contrast. Automated exposure control, iterative reconstruction, and/or weight based adjustment of the mA/kV was utilized to reduce the radiation dose to as low as reasonably achievable. COMPARISON: None available. CLINICAL HISTORY: Headache, increasing frequency or severity; Headache, new onset (Age >= 51y) FINDINGS: BRAIN AND VENTRICLES: No acute hemorrhage. No evidence of acute infarct. 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 No hydrocephalus. 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 Unchanged 14 mm partially calcified extra-axial dural-based mass along the posterior left falx. No mass effect. CONSISTENT WITH A MENINGIOM ORBITS: No acute abnormality. SINUSES: No acute abnormality. SOFT TISSUES AND SKULL: No acute soft tissue abnormality. No skull fracture. IMPRESSION: 1. No acute intracranial abnormality. 2. 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 Electronically signed by: Gilmore Molt MD 02/27/2024 07:19 PM EST RP Workstation: [id number]     Procedures   Medications Ordered in the ED   sodium chloride  0.9 % bolus 1,000 mL (0 mLs Intravenous Stopped 02/27/24 1951)                                    Medical Decision Making Amount and/or Complexity of Data Reviewed Labs: ordered. Radiology: ordered.   This patient presents to the ED for concern of generalized fatigue with vomiting and diarrhea, this involves an extensive number of treatment options, and is a complaint that carries with it a high risk of complications and morbidity.  The differential diagnosis includes viral GI illness, dehydration, UTI, electrolyte abnormality, anemia   Co morbidities / Chronic conditions that complicate the patient evaluation  GERD and hypertension   Lab Tests:  I Ordered, and personally interpreted labs.  The pertinent results include: Mild leukocytosis at 11.3, CMP unremarkable, respiratory panel negative, UA unremarkable   Imaging Studies ordered:  I ordered imaging studies including CT head  I  independently visualized and interpreted imaging which showed no acute intracranial abnormality.  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 I agree with the radiologist interpretation   Cardiac Monitoring: / EKG:  The patient was maintained on a cardiac monitor.  I personally viewed and interpreted the cardiac monitored which showed an underlying rhythm of: Sinus rhythm   Problem List / ED Course / Critical interventions / Medication management I ordered medication including IVF   I have reviewed the patients home medicines and have made adjustments as needed Patient orthostatic negative  Test / Admission - Considered:  Patient able to ambulate without difficulty prior to discharge. Patient reports she is feeling much better after IV fluids.  Patient requesting prescription for Zofran  as she has had intermittent nausea at home which she believes is contributing to her lack of eating and drinking is much as usual. Considered for admission or further workup  however patient's vital signs, physical exam, labs, and imaging are reassuring.  Patient symptoms likely due to mild dehydration.  Patient prescribed short course of Zofran  outpatient.  Patient advised to maintain adequate oral hydration.  Patient given return precautions.  I feel patient safe for discharge at this time.     Final diagnoses:  Generalized weakness    ED Discharge Orders          Ordered    ondansetron  (ZOFRAN -ODT) 4 MG disintegrating tablet  Every 8 hours PRN        02/27/24 1958               Francis Ileana SAILOR, PA-C 02/27/24 1958    Lenor Hollering, MD 02/27/24 2311

## 2024-02-27 NOTE — ED Notes (Signed)
 RN instructed pt to please provide stool sample as soon as she is able. Provided pt w/ hat to collect stool if she needs to use the restroom. Pt verbalized understanding.

## 2024-02-27 NOTE — Discharge Instructions (Addendum)
 Today you were seen for generalized weakness.  I suspect this is likely due to mild dehydration.  You have been prescribed Zofran  for nausea and vomiting.  Please make sure to maintain adequate oral hydration.  Thank you for letting us  treat you today. After reviewing your labs and imaging, I feel you are safe to go home. Please follow up with your PCP in the next several days and provide them with your records from this visit. Return to the Emergency Room if pain becomes severe or symptoms worsen.

## 2024-02-27 NOTE — ED Notes (Signed)
 Pt ambulatory with assistance of her husband. Steady gait with no complaints of dizziness or unsteadiness. Pt says she feels much better.

## 2024-02-27 NOTE — ED Notes (Signed)
 ED Provider at bedside.

## 2024-04-22 ENCOUNTER — Encounter: Payer: Self-pay | Admitting: *Deleted
